# Patient Record
Sex: Female | Born: 1970
Health system: Southern US, Community
[De-identification: ages and names within clinical notes are randomized; demographics above are authoritative.]

## PROBLEM LIST (undated history)

## (undated) DIAGNOSIS — O2230 Deep phlebothrombosis in pregnancy, unspecified trimester: Secondary | ICD-10-CM

## (undated) DIAGNOSIS — Z22322 Carrier or suspected carrier of Methicillin resistant Staphylococcus aureus: Secondary | ICD-10-CM

## (undated) DIAGNOSIS — D649 Anemia, unspecified: Secondary | ICD-10-CM

## (undated) DIAGNOSIS — B019 Varicella without complication: Secondary | ICD-10-CM

## (undated) HISTORY — DX: Varicella without complication: B01.9

## (undated) HISTORY — DX: Deep phlebothrombosis in pregnancy, unspecified trimester: O22.30

## (undated) HISTORY — DX: Carrier or suspected carrier of methicillin resistant Staphylococcus aureus: Z22.322

## (undated) HISTORY — DX: Anemia, unspecified: D64.9

## (undated) HISTORY — PX: KNEE ARTHROSCOPY: SUR90

---

## 2001-03-04 ENCOUNTER — Encounter (INDEPENDENT_AMBULATORY_CARE_PROVIDER_SITE_OTHER): Payer: Self-pay | Admitting: *Deleted

## 2001-03-04 ENCOUNTER — Ambulatory Visit (HOSPITAL_COMMUNITY): Admission: AD | Admit: 2001-03-04 | Discharge: 2001-03-04 | Payer: Self-pay | Admitting: *Deleted

## 2001-03-04 ENCOUNTER — Encounter: Payer: Self-pay | Admitting: Emergency Medicine

## 2001-07-20 ENCOUNTER — Other Ambulatory Visit: Admission: RE | Admit: 2001-07-20 | Discharge: 2001-07-20 | Payer: Self-pay | Admitting: Gynecology

## 2001-08-25 DIAGNOSIS — O2230 Deep phlebothrombosis in pregnancy, unspecified trimester: Secondary | ICD-10-CM

## 2001-08-25 HISTORY — DX: Deep phlebothrombosis in pregnancy, unspecified trimester: O22.30

## 2001-09-06 ENCOUNTER — Ambulatory Visit (HOSPITAL_BASED_OUTPATIENT_CLINIC_OR_DEPARTMENT_OTHER): Admission: RE | Admit: 2001-09-06 | Discharge: 2001-09-06 | Payer: Self-pay | Admitting: Orthopedic Surgery

## 2001-09-11 ENCOUNTER — Ambulatory Visit (HOSPITAL_COMMUNITY): Admission: RE | Admit: 2001-09-11 | Discharge: 2001-09-11 | Payer: Self-pay | Admitting: Orthopedic Surgery

## 2001-09-11 ENCOUNTER — Inpatient Hospital Stay (HOSPITAL_COMMUNITY): Admission: AD | Admit: 2001-09-11 | Discharge: 2001-09-14 | Payer: Self-pay | Admitting: *Deleted

## 2002-02-07 ENCOUNTER — Inpatient Hospital Stay (HOSPITAL_COMMUNITY): Admission: AD | Admit: 2002-02-07 | Discharge: 2002-02-10 | Payer: Self-pay | Admitting: Gynecology

## 2002-02-12 ENCOUNTER — Inpatient Hospital Stay (HOSPITAL_COMMUNITY): Admission: AD | Admit: 2002-02-12 | Discharge: 2002-02-12 | Payer: Self-pay | Admitting: Gynecology

## 2002-02-12 ENCOUNTER — Encounter: Admission: RE | Admit: 2002-02-12 | Discharge: 2002-03-14 | Payer: Self-pay | Admitting: Gynecology

## 2002-05-16 ENCOUNTER — Other Ambulatory Visit: Admission: RE | Admit: 2002-05-16 | Discharge: 2002-05-16 | Payer: Self-pay | Admitting: Gynecology

## 2003-12-13 ENCOUNTER — Encounter: Admission: RE | Admit: 2003-12-13 | Discharge: 2003-12-13 | Payer: Self-pay | Admitting: Internal Medicine

## 2003-12-28 ENCOUNTER — Other Ambulatory Visit: Admission: RE | Admit: 2003-12-28 | Discharge: 2003-12-28 | Payer: Self-pay | Admitting: Obstetrics and Gynecology

## 2005-05-09 ENCOUNTER — Other Ambulatory Visit: Admission: RE | Admit: 2005-05-09 | Discharge: 2005-05-09 | Payer: Self-pay | Admitting: Obstetrics and Gynecology

## 2006-01-26 ENCOUNTER — Emergency Department (HOSPITAL_COMMUNITY): Admission: EM | Admit: 2006-01-26 | Discharge: 2006-01-26 | Payer: Self-pay | Admitting: Emergency Medicine

## 2006-06-24 ENCOUNTER — Other Ambulatory Visit: Admission: RE | Admit: 2006-06-24 | Discharge: 2006-06-24 | Payer: Self-pay | Admitting: Obstetrics and Gynecology

## 2006-12-06 ENCOUNTER — Emergency Department (HOSPITAL_COMMUNITY): Admission: EM | Admit: 2006-12-06 | Discharge: 2006-12-06 | Payer: Self-pay | Admitting: Emergency Medicine

## 2006-12-07 ENCOUNTER — Encounter: Payer: Self-pay | Admitting: Vascular Surgery

## 2006-12-07 ENCOUNTER — Ambulatory Visit (HOSPITAL_COMMUNITY): Admission: RE | Admit: 2006-12-07 | Discharge: 2006-12-07 | Payer: Self-pay | Admitting: Emergency Medicine

## 2006-12-07 ENCOUNTER — Ambulatory Visit: Payer: Self-pay | Admitting: Vascular Surgery

## 2006-12-16 ENCOUNTER — Ambulatory Visit: Payer: Self-pay | Admitting: Vascular Surgery

## 2009-03-07 ENCOUNTER — Emergency Department (HOSPITAL_COMMUNITY): Admission: EM | Admit: 2009-03-07 | Discharge: 2009-03-07 | Payer: Self-pay | Admitting: Family Medicine

## 2009-03-12 ENCOUNTER — Ambulatory Visit: Payer: Self-pay | Admitting: Internal Medicine

## 2009-03-12 DIAGNOSIS — D649 Anemia, unspecified: Secondary | ICD-10-CM

## 2009-03-12 LAB — CONVERTED CEMR LAB
ALT: 16 units/L (ref 0–35)
Alkaline Phosphatase: 93 units/L (ref 39–117)
Basophils Relative: 1.3 % (ref 0.0–3.0)
Bilirubin, Direct: 0.1 mg/dL (ref 0.0–0.3)
CO2: 31 meq/L (ref 19–32)
Creatinine, Ser: 1 mg/dL (ref 0.4–1.2)
Folate: >20 ng/mL
GFR calc non Af Amer: 79.9 mL/min (ref >60)
Glucose, Bld: 111 mg/dL — ABNORMAL HIGH (ref 70–99)
Iron: 181 ug/dL — ABNORMAL HIGH (ref 42–145)
Lymphocytes Relative: 32.3 % (ref 12.0–46.0)
MCHC: 33.8 g/dL (ref 30.0–36.0)
Monocytes Absolute: 0.4 10*3/uL (ref 0.1–1.0)
Neutro Abs: 5.4 10*3/uL (ref 1.4–7.7)
Neutrophils Relative %: 61.4 % (ref 43.0–77.0)
Platelets: 335 10*3/uL (ref 150.0–400.0)
Potassium: 3.4 meq/L — ABNORMAL LOW (ref 3.5–5.1)
Saturation Ratios: 37 % (ref 20.0–50.0)
TSH: 0.8 microintl units/mL (ref 0.35–5.50)
Vitamin B-12: >1500 pg/mL — ABNORMAL HIGH (ref 211–911)
WBC: 8.7 10*3/uL (ref 4.5–10.5)

## 2009-03-13 ENCOUNTER — Encounter: Payer: Self-pay | Admitting: Internal Medicine

## 2010-02-26 ENCOUNTER — Ambulatory Visit: Payer: Self-pay | Admitting: Internal Medicine

## 2010-02-26 DIAGNOSIS — H04129 Dry eye syndrome of unspecified lacrimal gland: Secondary | ICD-10-CM | POA: Insufficient documentation

## 2010-02-27 LAB — CONVERTED CEMR LAB
BUN: 11 mg/dL (ref 6–23)
Calcium: 9.1 mg/dL (ref 8.4–10.5)
Chloride: 106 meq/L (ref 96–112)
Eosinophils Absolute: 0.1 10*3/uL (ref 0.0–0.7)
Eosinophils Relative: 2.1 % (ref 0.0–5.0)
GFR calc non Af Amer: 140.62 mL/min (ref >60)
HCT: 31.2 % — ABNORMAL LOW (ref 36.0–46.0)
Hemoglobin: 10.2 g/dL — ABNORMAL LOW (ref 12.0–15.0)
Lymphs Abs: 2.3 10*3/uL (ref 0.7–4.0)
MCV: 84.1 fL (ref 78.0–100.0)
Monocytes Relative: 9.4 % (ref 3.0–12.0)
Neutro Abs: 2.2 10*3/uL (ref 1.4–7.7)
Neutrophils Relative %: 43.6 % (ref 43.0–77.0)
RBC: 3.71 M/uL — ABNORMAL LOW (ref 3.87–5.11)
Sodium: 139 meq/L (ref 135–145)

## 2010-03-11 ENCOUNTER — Ambulatory Visit: Payer: Self-pay | Admitting: Internal Medicine

## 2010-03-11 ENCOUNTER — Telehealth: Payer: Self-pay | Admitting: Internal Medicine

## 2010-03-11 DIAGNOSIS — E049 Nontoxic goiter, unspecified: Secondary | ICD-10-CM | POA: Insufficient documentation

## 2010-03-11 DIAGNOSIS — R05 Cough: Secondary | ICD-10-CM

## 2010-04-02 ENCOUNTER — Encounter: Admission: RE | Admit: 2010-04-02 | Discharge: 2010-04-02 | Payer: Self-pay | Admitting: Internal Medicine

## 2010-04-02 DIAGNOSIS — E041 Nontoxic single thyroid nodule: Secondary | ICD-10-CM | POA: Insufficient documentation

## 2010-04-04 ENCOUNTER — Encounter: Payer: Self-pay | Admitting: Internal Medicine

## 2010-04-04 ENCOUNTER — Ambulatory Visit: Payer: Self-pay | Admitting: Internal Medicine

## 2010-04-04 LAB — CONVERTED CEMR LAB
Basophils Relative: 0.6 % (ref 0.0–3.0)
Eosinophils Absolute: 0.1 10*3/uL (ref 0.0–0.7)
HCT: 32 % — ABNORMAL LOW (ref 36.0–46.0)
Hemoglobin: 10.5 g/dL — ABNORMAL LOW (ref 12.0–15.0)
Iron: 48 ug/dL (ref 42–145)
Lymphocytes Relative: 46.4 % — ABNORMAL HIGH (ref 12.0–46.0)
MCV: 83.7 fL (ref 78.0–100.0)
Neutrophils Relative %: 40.4 % — ABNORMAL LOW (ref 43.0–77.0)
Platelets: 237 10*3/uL (ref 150.0–400.0)
RBC: 3.82 M/uL — ABNORMAL LOW (ref 3.87–5.11)
RDW: 17.1 % — ABNORMAL HIGH (ref 11.5–14.6)
WBC: 3.7 10*3/uL — ABNORMAL LOW (ref 4.5–10.5)

## 2010-06-02 ENCOUNTER — Emergency Department (HOSPITAL_COMMUNITY): Admission: EM | Admit: 2010-06-02 | Discharge: 2010-06-02 | Payer: Self-pay | Admitting: Emergency Medicine

## 2010-09-03 ENCOUNTER — Emergency Department (HOSPITAL_COMMUNITY)
Admission: EM | Admit: 2010-09-03 | Discharge: 2010-09-03 | Payer: Self-pay | Source: Home / Self Care | Admitting: Family Medicine

## 2010-09-15 ENCOUNTER — Encounter: Payer: Self-pay | Admitting: Internal Medicine

## 2010-09-24 NOTE — Progress Notes (Signed)
Summary: OV TODAY  Phone Note Call from Patient   Summary of Call: Pt scheduled for office visit with Dr Felicity Coyer today. C/o CP x 1 wk. No SOB, lightheadedness, dizzyness. C/o cough from PND. She feels this has caused the CP. Pt will call office or go to ER w/new or more severe symptoms, otherwise will keep f/u office visit today.  Initial call taken by: Lamar Sprinkles, CMA,  March 11, 2010 10:15 AM  Follow-up for Phone Call        ok Follow-up by: Newt Lukes MD,  March 11, 2010 12:38 PM

## 2010-09-24 NOTE — Assessment & Plan Note (Signed)
Summary: COUGH---STC   Vital Signs:  Patient profile:   40 year old female Height:      68 inches Weight:      210.75 pounds BMI:     32.16 O2 Sat:      99 % on Room air Temp:     98.5 degrees F oral Pulse rate:   74 / minute Pulse rhythm:   regular Resp:     16 per minute BP sitting:   108 / 64  (left arm) Cuff size:   large  Vitals Entered By: Rock Nephew CMA (April 04, 2010 8:13 AM)  Nutrition Counseling: Patient's BMI is greater than 25 and therefore counseled on weight management options.  O2 Flow:  Room air CC: follow-up visit// continue cough, Cough Is Patient Diabetic? No Pain Assessment Patient in pain? no        Primary Care Lisandro Meggett:  Etta Grandchild MD  CC:  follow-up visit// continue cough and Cough.  History of Present Illness:  Cough      This is a 40 year old woman who presents with Cough.  The symptoms began 4-8 weeks ago.  The intensity is described as mild.  The patient reports non-productive cough, but denies productive cough, pleuritic chest pain, shortness of breath, wheezing, exertional dyspnea, fever, hemoptysis, and malaise.  The patient denies the following symptoms: cold/URI symptoms, sore throat, nasal congestion, chronic rhinitis, weight loss, acid reflux symptoms, and peripheral edema.  Ineffective prior treatments have included OTC cough medication.    Preventive Screening-Counseling & Management  Alcohol-Tobacco     Alcohol drinks/day: 0     Smoking Status: never  Hep-HIV-STD-Contraception     Hepatitis Risk: no risk noted     HIV Risk: no risk noted     STD Risk: no risk noted      Sexual History:  currently monogamous.        Drug Use:  no.        Blood Transfusions:  no.    Medications Prior to Update: 1)  Tessalon 200 Mg Caps (Benzonatate) .Marland Kitchen.. 1 By Mouth Three Times A Day X 5 Days, Then As Needed For Cough 2)  Indomethacin 25 Mg Caps (Indomethacin) .Marland Kitchen.. 1 By Mouth Three Times A Day X 5 Days, Then As Needed For  Pain 3)  Iron 325 (65 Fe) Mg Tabs (Ferrous Sulfate) .Marland Kitchen.. 1 By Mouth Two Times A Day With Food  Current Medications (verified): 1)  Tessalon 200 Mg Caps (Benzonatate) .Marland Kitchen.. 1 By Mouth Three Times A Day X 5 Days, Then As Needed For Cough 2)  Indomethacin 25 Mg Caps (Indomethacin) .Marland Kitchen.. 1 By Mouth Three Times A Day X 5 Days, Then As Needed For Pain 3)  Iron 325 (65 Fe) Mg Tabs (Ferrous Sulfate) .Marland Kitchen.. 1 By Mouth Two Times A Day With Food  Allergies (verified): No Known Drug Allergies  Past History:  Past Medical History: Last updated: 03/11/2010 Anemia-NOS   MD roster: optho - [place] [facility]  Family History: Last updated: 03/12/2009 Family History of Alcoholism/Addiction  Risk Factors: Alcohol Use: 0 (04/04/2010) Exercise: yes (03/12/2009)  Past Surgical History: Denies surgical history  Family History: Reviewed history from 03/12/2009 and no changes required. Family History of Alcoholism/Addiction  Social History: Reviewed history from 03/11/2010 and no changes required. Occupation: Marketing executive Single  Never Smoked Alcohol use-no Drug use-no Regular exercise-yes Hepatitis Risk:  no risk noted HIV Risk:  no risk noted STD Risk:  no risk noted Sexual History:  currently monogamous Blood Transfusions:  no  Review of Systems       The patient complains of prolonged cough.  The patient denies anorexia, fever, weight loss, weight gain, decreased hearing, hoarseness, chest pain, syncope, dyspnea on exertion, peripheral edema, headaches, hemoptysis, abdominal pain, melena, hematochezia, severe indigestion/heartburn, hematuria, suspicious skin lesions, transient blindness, enlarged lymph nodes, and angioedema.   Endo:  Denies cold intolerance, excessive hunger, excessive thirst, excessive urination, heat intolerance, polyuria, and weight change. Heme:  Denies abnormal bruising, bleeding, enlarge lymph nodes, fevers, pallor, and skin discoloration.  Physical Exam  General:   alert, well-developed, well-nourished, well-hydrated, appropriate dress, normal appearance, healthy-appearing, cooperative to examination, and good hygiene.   Head:  normocephalic, atraumatic, no abnormalities observed, and no abnormalities palpated.   Eyes:  vision grossly intact, pupils equal, pupils round, and pupils reactive to light.   Ears:  R ear normal and L ear normal.   Nose:  External nasal examination shows no deformity or inflammation. Nasal mucosa are pink and moist without lesions or exudates. Mouth:  Oral mucosa and oropharynx without lesions or exudates.  Teeth in good repair. Neck:  supple, full ROM, no masses, no thyromegaly, no thyroid nodules or tenderness, normal carotid upstroke, no carotid bruits, no cervical lymphadenopathy, and no neck tenderness.   Chest Wall:  no deformities, no tenderness, and no mass.   Breasts:  skin/areolae normal, no masses, and no abnormal thickening.   Lungs:  normal respiratory effort, no intercostal retractions, no accessory muscle use, normal breath sounds, no dullness, no fremitus, no crackles, and no wheezes.   Heart:  normal rate, regular rhythm, no murmur, no gallop, no rub, and no JVD.   Abdomen:  soft, non-tender, normal bowel sounds, no distention, no masses, no guarding, no rigidity, no rebound tenderness, no abdominal hernia, no inguinal hernia, no hepatomegaly, and no splenomegaly.   Msk:  normal ROM, no joint tenderness, no joint swelling, no joint warmth, no redness over joints, no joint deformities, no joint instability, and no crepitation.   Pulses:  R and L carotid,radial,femoral,dorsalis pedis and posterior tibial pulses are full and equal bilaterally Extremities:  No clubbing, cyanosis, edema, or deformity noted with normal full range of motion of all joints.   Neurologic:  No cranial nerve deficits noted. Station and gait are normal. Plantar reflexes are down-going bilaterally. DTRs are symmetrical throughout. Sensory, motor and  coordinative functions appear intact. Skin:  turgor normal, color normal, no rashes, no suspicious lesions, no ecchymoses, no petechiae, no purpura, no ulcerations, and no edema.   Cervical Nodes:  no anterior cervical adenopathy and no posterior cervical adenopathy.   Axillary Nodes:  no R axillary adenopathy and no L axillary adenopathy.   Inguinal Nodes:  no R inguinal adenopathy and no L inguinal adenopathy.   Psych:  Cognition and judgment appear intact. Alert and cooperative with normal attention span and concentration. No apparent delusions, illusions, hallucinations   Impression & Recommendations:  Problem # 1:  COUGH (ICD-786.2) Assessment Deteriorated spirometry is neg for asthma so I will look at Chest Xray for lesions and treat symptomatically with tussionex susp Orders: T-2 View CXR (71020TC) Spirometry w/Graph (94010)  Problem # 2:  THYROID CYST (ICD-246.2) Assessment: Improved this appears to be benign  Problem # 3:  ANEMIA-NOS (ICD-285.9) Assessment: Unchanged  Her updated medication list for this problem includes:    Iron 325 (65 Fe) Mg Tabs (Ferrous sulfate) .Marland Kitchen... 1 by mouth two times a day with food  Orders: Venipuncture (  16109) TLB-B12 + Folate Pnl (82746_82607-B12/FOL) TLB-IBC Pnl (Iron/FE;Transferrin) (83550-IBC) TLB-CBC Platelet - w/Differential (85025-CBCD)  Hgb: 10.2 (02/26/2010)   Hct: 31.2 (02/26/2010)   Platelets: 303.0 (02/26/2010) RBC: 3.71 (02/26/2010)   RDW: 15.5 (02/26/2010)   WBC: 5.1 (02/26/2010) MCV: 84.1 (02/26/2010)   MCHC: 32.6 (02/26/2010) Iron: 181 (03/12/2009)   % Sat: 37.0 (03/12/2009) B12: >1500 pg/mL (03/12/2009)   Folate: >20.0 ng/mL (03/12/2009)   TSH: 0.95 (02/26/2010)  Complete Medication List: 1)  Iron 325 (65 Fe) Mg Tabs (Ferrous sulfate) .Marland Kitchen.. 1 by mouth two times a day with food 2)  Tussionex Pennkinetic Er 8-10 Mg/48ml Lqcr (Chlorpheniramine-hydrocodone) .... 5 ml by mouth two times a day as needed for cough  Patient  Instructions: 1)  Please schedule a follow-up appointment in 2 weeks. Prescriptions: TUSSIONEX PENNKINETIC ER 8-10 MG/5ML LQCR (CHLORPHENIRAMINE-HYDROCODONE) 5 ml by mouth two times a day as needed for cough  #4 ounces x 1   Entered and Authorized by:   Etta Grandchild MD   Signed by:   Etta Grandchild MD on 04/04/2010   Method used:   Print then Give to Patient   RxID:   [id number]

## 2010-09-24 NOTE — Letter (Signed)
Summary: Results Follow-up Letter  [facility]  [address] [place], [place] [address]   Phone: [phone]  Fax: [phone]    04/04/2010  [address] [place], [place]  [address]  Dear Ms. Wadel,   The following are the results of your recent test(s):  Test     Result     Chest Xray     normal CBC       anemia Iron       slightly low B12 level     normal   _________________________________________________________  Please call for an appointment in 2-3 weeks _________________________________________________________ _________________________________________________________ _________________________________________________________  Sincerely,  Sanda Linger MD [facility]

## 2010-09-24 NOTE — Assessment & Plan Note (Signed)
Summary: EYE PROBLEM/ HER EYE DR. Dewayne Hatch HER THYROID CHECKED/NWS   Vital Signs:  Patient profile:   40 year old female Height:      68 inches (172.72 cm) Weight:      218.4 pounds (99.27 kg) BMI:     33.33 O2 Sat:      98 % on Room air Temp:     98.3 degrees F (36.83 degrees C) oral Pulse rate:   75 / minute BP sitting:   102 / 62  (left arm) Cuff size:   large  Vitals Entered By: Orlan Leavens (February 26, 2010 3:04 PM)  O2 Flow:  Room air CC: Having problems with her eyes. was told by opthamologist she need to have her thyroid check Is Patient Diabetic? No Pain Assessment Patient in pain? no        Primary Care Provider:  Etta Grandchild MD  CC:  Having problems with her eyes. was told by opthamologist she need to have her thyroid check.  History of Present Illness: c/o eye problems - dryness and tearing onset 3 months ago symptoms affect both eyes equally no vision changes or blindness - no blurring no allergy or sinus symptoms  recent eval by optho who felt eyes are fine but rec thyroid check - on sustain (OTC) drops every hour while awake to mosturize - +crusting each AM upon waking no mouth dryness - no neck swelling or trouble swallowing - no FH thyroid or DM problems  Current Medications (verified): 1)  None  Allergies (verified): No Known Drug Allergies  Past History:  Past Medical History: Anemia-NOS  MD roster: optho - [place] [facility]  Review of Systems  The patient denies fever, weight loss, vision loss, and headaches.    Physical Exam  General:  alert, well-developed, well-nourished, well-hydrated, appropriate dress, normal appearance, healthy-appearing, cooperative to examination, and good hygiene.   Eyes:  vision grossly intact; pupils equal, round and reactive to light.  conjunctiva and lids normal.    Ears:  normal pinnae bilaterally, without erythema, swelling, or tenderness to palpation. TMs clear, without effusion, or cerumen impaction.  Hearing grossly normal bilaterally  Mouth:  teeth and gums in good repair; mucous membranes moist, without lesions or ulcers. oropharynx clear without exudate, no erythema.  Neck:  thick and full but supple, full ROM, no masses, no cervical lymphadenopathy, and no neck tenderness.   Lungs:  normal respiratory effort, no intercostal retractions or use of accessory muscles; normal breath sounds bilaterally - no crackles and no wheezes.    Heart:  normal rate, regular rhythm, no murmur, and no rub. BLE without edema.   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) using saline drops now - ?allg component - check labs r/o thyroid dz, DM, infx or other inflamation contrib to her symptoms - no hx to support systemic autoimmune dz but eye symptoms may represent nonsp symptom early in course of dx -  rec f/u with PCP for close f/u same if these labs normal, rec cont f/u for tx same with optho pt understands and agrees to same Orders: TLB-BMP (Basic Metabolic Panel-BMET) (80048-METABOL) TLB-TSH (Thyroid Stimulating Hormone) (84443-TSH) TLB-CBC Platelet - w/Differential (85025-CBCD) TLB-Sedimentation Rate (ESR) (85652-ESR)  Patient Instructions: 1)  it was good to see you today. 2)  test(s) ordered today - your results will be posted on the phone tree for review in 48-72 hours from the time of test completion; call [phone] and enter your 9  digit MRN (listed above on this page, just below your name); if any changes need to be made or there are abnormal results, you will be contacted directly.  3)  Please schedule a follow-up appointment in 2 weeks with dr. Yetta Barre to review these results and the dry eye symptoms, call sooner if problems.

## 2010-09-24 NOTE — Assessment & Plan Note (Signed)
Summary: DR Nemiah Commander PT/NO SLOT--CHEST PAIN--STC   Vital Signs:  Patient profile:   40 year old female Height:      68 inches (172.72 cm) Weight:      217.8 pounds (99 kg) O2 Sat:      99 % on Room air Temp:     98.6 degrees F (37.00 degrees C) oral Pulse rate:   73 / minute BP sitting:   102 / 68  (left arm) Cuff size:   large  Vitals Entered By: Orlan Leavens (March 11, 2010 3:38 PM)  O2 Flow:  Room air CC: chest pain Is Patient Diabetic? No Pain Assessment Patient in pain? yes     Location: chest Type: SORENESS Comments Pt states her chest is sore. Have this cough tingling feeling. Pt denies pain dowin (L) arm, being nausated   Primary Care Provider:  Etta Grandchild MD  CC:  chest pain.  History of Present Illness:       This is a 40 year old female who presents with Chest pain.  The symptoms began 5 days ago.  On a scale of 1 to 10, the intensity is described as a 4 or 5.  no known heart problems.  The patient reports resting and activity related chest pain, but denies nausea, vomiting, diaphoresis, shortness of breath, palpitations, dizziness, light headedness, syncope, and indigestion.  The pain is described as intermittent and dull.  Occurs with cough or turnung stering wheel of car.  The pain is located in the substernal area and the pain does not radiate.  Episodes of chest pain last >30 minutes.  The pain is brought on or made worse by any activity and coughing.  The pain is relieved or improved with change in position.   has ot taken anything otc for cough or pain.  also reviewed prior OV from 02/26/10 c/o eye problems - dryness and tearing onset 3 months ago symptoms affect both eyes equally no vision changes or blindness - no blurring no allergy or sinus symptoms  recent eval by optho who felt eyes are fine but rec thyroid check - on sustain (OTC) drops every hour while awake to mosturize - +crusting each AM upon waking no mouth dryness - no neck swelling or trouble  swallowing - no FH thyroid or DM problems  wants to review lab results from 2 weeks ago  Current Medications (verified): 1)  None  Allergies (verified): No Known Drug Allergies  Past History:  Past Medical History: Anemia-NOS   MD roster: optho - [place] [facility]  Social History: Occupation: Marketing executive Single  Never Smoked Alcohol use-no Drug use-no Regular exercise-yes  Review of Systems  The patient denies fever, weight loss, syncope, dyspnea on exertion, hemoptysis, and abdominal pain.    Physical Exam  General:  alert, well-developed, well-nourished, and cooperative to examination.   nontox Eyes:  vision grossly intact; pupils equal, round and reactive to light.  conjunctiva and lids normal.    Ears:  normal pinnae bilaterally, without erythema, swelling, or tenderness to palpation. TMs clear, without effusion, or cerumen impaction. Hearing grossly normal bilaterally  Mouth:  teeth and gums in good repair; mucous membranes moist, without lesions or ulcers. oropharynx clear without exudate, min erythema. no pnd Chest Wall:  no deformities, no mass, and costochondrial tenderness.   Lungs:  normal respiratory effort, no intercostal retractions or use of accessory muscles; normal breath sounds bilaterally - no crackles and no wheezes.    Heart:  normal rate,  regular rhythm, no murmur, and no rub. BLE without edema. Neurologic:  She has very mild left facial, peripheral palsy. alert & oriented X3, strength normal in all extremities, sensation intact to light touch, sensation intact to pinprick, gait normal, and DTRs symmetrical and normal.     Impression & Recommendations:  Problem # 1:  COSTOCHONDRITIS (ICD-733.6)  expect this viral syndrome explains cough and chest pain - hx neg for cardiac flags and exam beign (afeb, no rub, hd stable) tx with indometh three times a day x 5 d, then as needed pain reassurance provided  Discussed medication use, applications of  heat or ice, and exercises.   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 1 by mouth two times a day with food  Hgb: 10.2 (02/26/2010)   Hct: 31.2 (02/26/2010)   Platelets: 303.0 (02/26/2010) RBC: 3.71 (02/26/2010)   RDW: 15.5 (02/26/2010)   WBC: 5.1 (02/26/2010) MCV: 84.1 (02/26/2010)   MCHC: 32.6 (02/26/2010) Iron: 181 (03/12/2009)   % Sat: 37.0 (03/12/2009) B12: >1500 pg/mL (03/12/2009)   Folate: >20.0 ng/mL (03/12/2009)   TSH: 0.95 (02/26/2010)  Complete Medication List: 1)  Tessalon 200 Mg Caps (Benzonatate) .Marland Kitchen.. 1 by mouth three times a day x 5 days, then as needed for cough 2)  Indomethacin 25 Mg Caps (Indomethacin) .Marland Kitchen.. 1 by mouth three times a day x 5 days, then as needed for pain 3)  Mucinex Dm 30-600 Mg Xr12h-tab (Dextromethorphan-guaifenesin) .Marland Kitchen.. 1 by mouth two times a day x 5 days 4)  Iron 325 (65 Fe) Mg Tabs (Ferrous  sulfate) .Marland Kitchen.. 1 by mouth two times a day with food  Patient Instructions: 1)  it was good to see you today. 2)  medications for cough and chest pain as discussed - your prescriptions have [facility] electronically [facility] to [facility] in [place]. Please take as directed. Contact our office if you believe you're having problems with the medication(s). 3)  we'll make referral for thyroid ultrasound to look for enlargemnt or nodules. Our office will contact you regarding this appointment once made.   4)  take iron pills for anemia - 5)  Please schedule a follow-up appointment in 3-4 weeks with dr. Yetta Barre to review eye symptoms and ultrasound results, sooner if problems.  Prescriptions: INDOMETHACIN 25 MG CAPS (INDOMETHACIN) 1 by mouth three times a day x 5 days, then as needed for  pain  #40 x 0   Entered and Authorized by:   Newt Lukes MD   Signed by:   Newt Lukes MD on 03/11/2010   Method used:   [facility] to        [facility]  [place] [id number]       [address]       [place], [place]  [address]       Ph: [phone] or [phone]       Fax: [phone]   RxID:   [id number] TESSALON 200 MG CAPS (BENZONATATE) 1 by mouth three times a day x 5 days, then as needed for cough  #40 x 1   Entered and Authorized by:   Newt Lukes MD   Signed by:   Newt Lukes MD on 03/11/2010   Method used:   [facility]        [facility]  [place] [facility]ret[facility]       [address]       [place], [place]  [address]       Ph: [phone] or [phone]       Fax: [phone]   RxID:   [id number]

## 2010-10-03 ENCOUNTER — Other Ambulatory Visit: Payer: Self-pay | Admitting: Internal Medicine

## 2010-10-03 ENCOUNTER — Telehealth: Payer: Self-pay | Admitting: Internal Medicine

## 2010-10-03 DIAGNOSIS — E041 Nontoxic single thyroid nodule: Secondary | ICD-10-CM

## 2010-10-08 ENCOUNTER — Other Ambulatory Visit: Payer: Self-pay

## 2010-10-08 ENCOUNTER — Ambulatory Visit
Admission: RE | Admit: 2010-10-08 | Discharge: 2010-10-08 | Disposition: A | Discharge status: Home / Self Care | Payer: 59 | Source: Ambulatory Visit | Attending: Internal Medicine | Admitting: Internal Medicine

## 2010-10-08 ENCOUNTER — Encounter: Payer: Self-pay | Admitting: Internal Medicine

## 2010-10-08 DIAGNOSIS — E041 Nontoxic single thyroid nodule: Secondary | ICD-10-CM

## 2010-10-10 NOTE — Progress Notes (Signed)
----   Converted from flag ---- ---- 10/03/2010 8:24 AM, Newt Lukes MD wrote: doesn't look like she has sched f/u with you on this at this time.... since she is your pt, do you mind taking this followup issue - thanks  ---- 04/02/2010 1:02 PM, Newt Lukes MD wrote: arrange repeat US ( 6-42mo) f/u for left thyroid cyst ([facility] pt) if not already done ------------------------------

## 2010-12-27 ENCOUNTER — Encounter: Payer: Self-pay | Admitting: Internal Medicine

## 2010-12-30 ENCOUNTER — Ambulatory Visit (INDEPENDENT_AMBULATORY_CARE_PROVIDER_SITE_OTHER)
Admission: RE | Admit: 2010-12-30 | Discharge: 2010-12-30 | Disposition: A | Discharge status: Home / Self Care | Payer: 59 | Source: Ambulatory Visit | Attending: Internal Medicine | Admitting: Internal Medicine

## 2010-12-30 ENCOUNTER — Encounter: Payer: Self-pay | Admitting: Internal Medicine

## 2010-12-30 ENCOUNTER — Other Ambulatory Visit (INDEPENDENT_AMBULATORY_CARE_PROVIDER_SITE_OTHER): Payer: 59

## 2010-12-30 ENCOUNTER — Ambulatory Visit (INDEPENDENT_AMBULATORY_CARE_PROVIDER_SITE_OTHER): Payer: 59 | Admitting: Internal Medicine

## 2010-12-30 DIAGNOSIS — M79671 Pain in right foot: Secondary | ICD-10-CM

## 2010-12-30 DIAGNOSIS — E041 Nontoxic single thyroid nodule: Secondary | ICD-10-CM

## 2010-12-30 DIAGNOSIS — M79609 Pain in unspecified limb: Secondary | ICD-10-CM

## 2010-12-30 DIAGNOSIS — D649 Anemia, unspecified: Secondary | ICD-10-CM

## 2010-12-30 DIAGNOSIS — E049 Nontoxic goiter, unspecified: Secondary | ICD-10-CM

## 2010-12-30 LAB — CBC WITH DIFFERENTIAL/PLATELET
Basophils Relative: 0.5 % (ref 0.0–3.0)
Eosinophils Relative: 3.3 % (ref 0.0–5.0)
HCT: 32.1 % — ABNORMAL LOW (ref 36.0–46.0)
Hemoglobin: 10.8 g/dL — ABNORMAL LOW (ref 12.0–15.0)
Lymphs Abs: 1.6 10*3/uL (ref 0.7–4.0)
MCV: 91.7 fl (ref 78.0–100.0)
Monocytes Relative: 9.6 % (ref 3.0–12.0)
Neutro Abs: 1.9 10*3/uL (ref 1.4–7.7)
RBC: 3.5 Mil/uL — ABNORMAL LOW (ref 3.87–5.11)
WBC: 4 10*3/uL — ABNORMAL LOW (ref 4.5–10.5)

## 2010-12-30 LAB — COMPREHENSIVE METABOLIC PANEL
BUN: 10 mg/dL (ref 6–23)
CO2: 27 mEq/L (ref 19–32)
GFR: 121.45 mL/min (ref >60.00)
Glucose, Bld: 75 mg/dL (ref 70–99)
Sodium: 138 mEq/L (ref 135–145)
Total Bilirubin: 0.4 mg/dL (ref 0.3–1.2)
Total Protein: 7 g/dL (ref 6.0–8.3)

## 2010-12-30 LAB — TSH: TSH: 0.95 u[IU]/mL (ref 0.35–5.50)

## 2010-12-30 NOTE — Assessment & Plan Note (Signed)
I will check plain films today and look at labs for gout, inflammation, etc

## 2010-12-30 NOTE — Progress Notes (Signed)
  Subjective:    Patient ID: April Levine, female    DOB: 1971-07-01, 40 y.o.   MRN: [id number]  HPI She comes in today c/o pain in her right foot on toes 2-5, she does not think she injured them. She is concerned that she may have gout. The pain is subsiding and now she has numbness and burning in the same toes. She has been taking OTC nsaids with good pain relief.   Review of Systems  Constitutional: Negative for fever, chills, diaphoresis, activity change, appetite change, fatigue and unexpected weight change.  HENT: Negative for sore throat, facial swelling, trouble swallowing, neck pain, neck stiffness and voice change.   Respiratory: Negative for apnea, cough, choking, chest tightness, shortness of breath, wheezing and stridor.   Cardiovascular: Negative for chest pain and leg swelling.  Gastrointestinal: Negative for nausea, vomiting, abdominal pain, diarrhea, constipation, blood in stool and abdominal distention.  Musculoskeletal: Positive for arthralgias. Negative for myalgias, back pain, joint swelling and gait problem.  Skin: Negative for color change, pallor, rash and wound.  Neurological: Negative for dizziness, facial asymmetry and headaches.  Hematological: Negative for adenopathy. Does not bruise/bleed easily.       Objective:   Physical Exam  [vitalsreviewed. Constitutional: She is oriented to person, place, and time. She appears well-developed and well-nourished. No distress.  HENT:  Head: Normocephalic and atraumatic.  Right Ear: External ear normal.  Left Ear: External ear normal.  Nose: Nose normal.  Mouth/Throat: Oropharynx is clear and moist. No oropharyngeal exudate.  Eyes: Conjunctivae and EOM are normal. Pupils are equal, round, and reactive to light. Right eye exhibits no discharge. Left eye exhibits no discharge. No scleral icterus.  Neck: Normal range of motion. Neck supple. No JVD present. No tracheal deviation present. No thyromegaly present.    Cardiovascular: Normal rate, regular rhythm, normal heart sounds and intact distal pulses.  Exam reveals no gallop and no friction rub.   No murmur heard. Pulmonary/Chest: Effort normal and breath sounds normal. No stridor. No respiratory distress. She has no wheezes. She has no rales. She exhibits no tenderness.  Abdominal: Soft. Bowel sounds are normal. She exhibits no distension and no mass. There is no tenderness. There is no rebound and no guarding.  Musculoskeletal: Normal range of motion. She exhibits no edema and no tenderness.       Right foot: Normal. She exhibits normal range of motion, no tenderness, no bony tenderness, no swelling, normal capillary refill, no crepitus, no deformity and no laceration.  Lymphadenopathy:    She has no cervical adenopathy.  Neurological: She is alert and oriented to person, place, and time. She has normal reflexes. She displays normal reflexes. No cranial nerve deficit. She exhibits normal muscle tone. Coordination normal.  Skin: Skin is warm and dry. No rash noted. She is not diaphoretic. No erythema. No pallor.  Psychiatric: She has a normal mood and affect. Her behavior is normal. Judgment and thought content normal.        Lab Results  Component Value Date   WBC 3.7* 04/04/2010   HGB 10.5* 04/04/2010   HCT 32.0* 04/04/2010   PLT 237.0 04/04/2010   ALT 16 03/12/2009   AST 15 03/12/2009   NA 139 02/26/2010   K 4.1 02/26/2010   CL 106 02/26/2010   CREATININE 0.6 02/26/2010   BUN 11 02/26/2010   CO2 29 02/26/2010   TSH 0.95 02/26/2010    Assessment & Plan:

## 2010-12-30 NOTE — Assessment & Plan Note (Signed)
I will recheck her CBC today and look at iron and B12 level as well

## 2010-12-30 NOTE — Assessment & Plan Note (Signed)
This appears to have resolved. 

## 2010-12-30 NOTE — Patient Instructions (Signed)
Foot Sprain The muscles and cord like structures which attach muscle to bone (tendons) that surround the feet are made up of units. A foot sprain can occur at the weakest spot in any of these units. This condition is most often caused by injury to or overuse of the foot, as from playing contact sports, or aggravating a previous injury, or from poor conditioning, or obesity.  DIAGNOSIS Diagnosis of this condition is usually by your own observation. If problems continue, a caregiver may be required for further evaluation and treatment. X-rays may be required to make sure there are not breaks in the bones (fractures) present. Continued problems may require physical therapy for treatment. SYMPTOMS OF FOOT SPRAIN ARE:  Pain with movement of the foot.   Tenderness and swelling at the injury site.   Loss of strength is present in moderate or severe sprains.  THE THREE GRADES OR SEVERITY OF FOOT SPRAIN ARE:  Mild (Grade I): Slightly pulled muscle without tearing of muscle or tendon fibers or loss of strength.   Moderate (Grade II): Tearing of fibers in a muscle, tendon, or at the attachment to bone, with small decrease in strength.   Severe (Grade III): Rupture of the muscle-tendon-bone attachment, with separation of fibers. Severe sprain requires surgical repair. Often repeating (chronic) sprains are caused by overuse. Sudden (acute) sprains are caused by direct injury or over-use.  HOME CARE INSTRUCTIONS  Apply ice to the injury for 20 minutes,3 times per day. Put the ice in a plastic bag and place a towel between the bag of ice and your skin.   An elastic wrap (like an Ace bandage) may be used to keep swelling down.   Keep foot above the level of the heart, or at least raised on a footstool, when swelling and pain are present.   Try to avoid use other than gentle range of motion while the foot is painful. Do not resume use until instructed by your caregiver. Then begin use gradually, not  increasing use to the point of pain. If pain does develop, decrease use and continue the above measures, gradually increasing activities that do not cause discomfort, until you gradually achieve normal use.   Use crutches if and as instructed, and for the length of time instructed.   Keep injured foot and ankle wrapped between treatments.   Massage foot and ankle for comfort and to keep swelling down. Massage from the toes up towards the knee.   Only take over-the-counter or prescription medicines for pain, discomfort, or fever as directed by your caregiver.  PREVENTION OF FOOT SPRAIN  Use strength and conditioning exercises appropriate for your sport.   Warm up properly prior to working out.   Use athletic shoes that are made for the sport you are participating in.   Allow adequate time for healing. Early return to activities makes repeat injury more likely, and can lead to an unstable arthritic foot that can result in prolonged disability. Mild sprains generally heal in 3 to 10 days, with moderate and severe sprains taking 2 to 10 weeks. Your caregiver can help you determine the proper time required for healing.  SEEK IMMEDIATE MEDICAL CARE IF:  Your pain and swelling increase, or pain is not controlled with medications.   You have loss of feeling in your foot or your foot turns cold or blue.   You develop new, unexplained symptoms, or an increase of the symptoms that brought you to your caregiver.  MAKE SURE YOU:  Understand these instructions.   Will watch your condition.   Will get help right away if you are not doing well or get worse.  Document Released: 01/31/2002 Document Re-Released: 11/05/2009 [facility] Patient Information 2011 [place], [place].Anemia - Nonspecific Your exam and blood tests show you are anemic. This means your blood (hemoglobin) level is low. Normal hemoglobin values are 12-15 for females and 14-17 for males. Make a note of your hemoglobin level today. The  hematocrit percent is also used to measure anemia. A normal hematocrit is 38-46 in females and 42-49 in males. Make a note of your hematocrit level today. SYMPTOMS Anemia can come on suddenly (acute). It can also come on slowly (chronic). Symptoms can include:  Minor weakness.   Dizziness.   Palpitations.  Shortness of breath.   Symptoms may be absent until half your hemoglobin is missing if it comes on slowly. Anemia due to acute blood loss from an injury or internal bleeding may require blood transfusion if the loss is severe. Hospital care is needed if you are anemic and there is significant continued blood loss. CAUSES Anemia can be due to many different causes.  Excessive bleeding from periods is a common problem in women.  Other causes can include:  Intestinal bleeding.   Poor nutrition.   Kidney, thyroid, liver, and bone marrow diseases.  TREATMENT  Stool tests for blood (Hemoccult) and additional lab tests are often needed. This determines the best treatment.   Further checking on your condition and your response to treatment is very important. It often takes many weeks to correct anemia.  Depending on the cause, treatment can include:  Supplements of iron.   Vitamins B12 and folic acid.   Hormone medicines.  If your anemia is due to bleeding, finding the cause of the blood loss is very important. This will help avoid further problems. SEEK IMMEDIATE MEDICAL CARE IF:  You develop fainting, extreme weakness, shortness of breath, or chest pain.   You develop heavy vaginal bleeding.   You develop bloody or black, tarry stools or vomit up blood.   You develop a high fever, rash, repeated vomiting, or dehydration.  Document Released: 09/18/2004 Document Re-Released: 01/29/2010 [facility] Patient Information 2011 [place], [place].

## 2010-12-31 ENCOUNTER — Encounter: Payer: Self-pay | Admitting: Internal Medicine

## 2010-12-31 LAB — C-REACTIVE PROTEIN: CRP: 0.4 mg/dL (ref <0.6)

## 2011-01-01 LAB — METHYLMALONIC ACID, SERUM: Methylmalonic Acid, Quantitative: 100 nmol/L (ref 87–318)

## 2011-01-10 NOTE — Op Note (Signed)
[facility]  Patient:    SHAJUANA, MCLUCAS                        MRN: [id number] Proc. Date: 03/04/01 Adm. Date:  [id number] Disc. Date: [id number] Attending:  Doug Sou                           Operative Report  INDICATIONS:                  A 40 year old woman, G6, P1-0-5-1, status post first trimester therapeutic abortion in [place] on February 27, 2001 who presented at [facility] on the evening of July 10 with complaints of increasing cramping and bleeding. She apparently has been taking doxycycline and Methergine but the pain has become more significant. She was transferred to [facility] for further evaluation. Ultrasound at [facility] suggested retained products of conception. Quantitative hCG was 10,870.  PREOPERATIVE DIAGNOSIS:       Retained products of conception, status post first trimester abortion on February 27, 2001.  POSTOPERATIVE DIAGNOSIS:      Retained products of conception, status post first trimester abortion on February 27, 2001.  OPERATION:                    Dilatation and evacuation.  SURGEON:                      Sung Amabile. Roslyn Smiling, M.D.  ANESTHESIA:                   IV sedation and paracervical block.  ESTIMATED BLOOD LOSS:         50 cc.  TUBES AND DRAINS:             None.  COMPLICATIONS:                None.  FINDINGS:                     Preoperative uterine size six to eight weeks, postoperative uterine size six weeks. Small fragments of tissue on curettage. Uterine cavity smooth.  SPECIMENS:                    Uterine curettings to pathology.  DESCRIPTION OF PROCEDURE:     The patient was consented prior to surgery regarding the benefits, risks, options, and expected outcome as well as alternatives to surgery. Risks including hemorrhage, transfusion, infection, uterine perforation with injury to intra-abdominal organs (bowel, bladder, reproductive organs, blood vessels, nerves, ureters) with possible  need for laparoscopy or laparotomy discussed. Retained products of conception possibility reviewed. Questions were answered and consent was obtained.  After the establishment of adequate IV sedation, the patient was placed in the dorsal lithotomy position. ______ The patient was draped. Graves speculum was inserted in the vagina and the cervix was reprepped with Betadine solution. The anterior cervical lip was infiltrated with 1% Xylocaine and then grasped with a single-tooth tenaculum. Paracervical block was placed in the usual fashion using 20 cc of 1% Xylocaine. Pratt dilators were used to dilate the cervix to a #25 [place]. A #8 suction curet was passed easily into the endometrial cavity. Suction curettage was performed. Small fragments of tissue were obtained. Gentle sharp curettage was performed. A final pass with the suction curet was made. The uterus was felt to be empty and  smooth at the end of the case. The cavity was regular. Instruments were removed and hemostasis was noted. The patient was returned to the supine position and transferred to the recovery room in satisfactory condition. DD:  03/04/01 TD:  03/04/01 Job: [id number]

## 2011-01-10 NOTE — Discharge Summary (Signed)
[facility]  Patient:    SENECA, GADBOIS Visit Number: [id number] MRN: [id number]          Service Type: GYN Location: [facility] Attending Physician:  Tonye Royalty Dictated by:   Antony Contras, [facility] Admit Date:  02/12/2002 Discharge Date: 02/12/2002                             Discharge Summary  DISCHARGE DIAGNOSES: 1. Previous cesarean section for elective repeat. 2. History of deep venous thrombosis and anticoagulation. 3. Extensive pelvic adhesions.  PROCEDURES:  Repeat cesarean section with extensive adehelysis, delivery of viable infant.  HISTORY OF PRESENT ILLNESS:  The patient is a 40 year old, gravida 7, para 1-0-5-1, with a last menstrual period of 05/12/01, estimated date of confinement 02/17/02.  Prenatal course was complicated by a history of deep venous thrombosis.  The patient was anticoagulated with Lovenox 100 mg subcutaneously b.i.d.  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  LABORATORY DATA:  Blood type A+, antibody screen negative, sickle cell negative.  RPR, GBS, HIV, nonreactive.  HOSPITAL COURSE:  The patient was admitted on 02/07/02, for repeat cesarean section.  The procedure was performed by Dr. Douglass Rivers, assisted by Dr. Reynaldo Minium under spinal anesthesia, converted to general.  Findings included delivery of a viable female infant, vertex presentation, Apgars 8 and 9, birth weight 7 pounds 1 ounce, normal tubes and ovaries, extensive thick uterine serosal adhesions to the anterior abdominal wall on the left.  Postoperatively, the patient remained afebrile.  Had no difficulty voiding, and was able to be discharged in satisfactory condition on her third postoperative day.  Lovenox was restarted postoperatively.  The patient was transitioned to Coumadin 10 mg p.o. prior to discharge.  LABORATORY DATA:  CBC showed a hematocrit of 26.3, hemoglobin 8.9, white  blood cell count 11.5, platelets 239.  FOLLOWUP:  In six weeks.  DISCHARGE MEDICATIONS: 1. Continue prenatal vitamins. 2. Iron. 3. Motrin for pain. 4. Tylox for pain. Dictated by:   Antony Contras, [facility] Attending Physician:  Tonye Royalty DD:  03/10/02 TD:  03/15/02 Job: [phone]

## 2011-01-10 NOTE — Op Note (Signed)
[facility]  Patient:    April Levine, April Levine Visit Number: [id number] MRN: [id number]          Service Type: OBS Location: 910A 9108 [id number] Attending Physician:  Douglass Rivers Dictated by:   Douglass Rivers, M.D. Proc. Date: 02/07/02 Admit Date:  02/07/2002                             Operative Report  PREOPERATIVE DIAGNOSES:       1. Previous cesarean section for elective                                  repeat.                               2. History of deep venous thrombosis and                                  anticoagulation.  POSTOPERATIVE DIAGNOSES:      1. Previous cesarean section for elective                                  repeat.                               2. History of deep venous thrombosis and                                  anticoagulation.                               3. Extensive pelvic adhesions.  OPERATION:                    Repeat cesarean section, low flap transverse                               with extensive adhesiolysis.  SURGEON:                      Douglass Rivers, M.D.  ASSISTANT:                    Gaetano Hawthorne. Lily Peer, M.D.  ANESTHESIA:                   Spinal converted to general.  ESTIMATED BLOOD LOSS:         1100 cc.  URINE OUTPUT:                 400 cc of clear urine.  FINDINGS:                     Viable female infant, vertex presentation, clear amniotic fluid.  Apgars 8 and 9.  Birth weight 7 pounds and 1 ounces.  Normal tubes and ovaries.  There was extensive thick uterine serosal adhesions to the anterior abdominal wall on the left.  COMPLICATIONS:  Extensive adhesiolysis.  PATHOLOGY:                    None.  DESCRIPTION OF PROCEDURE:     The patient was taken to the operating room, spinal anesthesia was induced, and placed in the supine position with left lateral displacement, prepped and draped in the usual sterile fashion.  A Pfannenstiel skin incision was made with the scalpel,  going through the previous scar.  Because of the recent Lovenox use, the remainder of the dissection was carried through with electrocautery with careful attention to any dissecting subcutaneous vessels.  The incision was carried through to the underlying fascia.  The fascia was scored in the midline.  The incision was then extended laterally with electrocautery over Kelly clamps.  This was done bilaterally.  The inferior aspect of the fascial incision was grasped with Kochers.  The underlying rectus muscles were dissected off by blunt and sharp dissection.  Again, this was also done with cautery with careful attention to any bleeding.  The superior aspect of the incision was then grasped with Kochers.  The underlying rectus muscles were partially dissected off.  They were markedly scarred and there was noted to be natural diastasis of the muscle.  For fear of injuring the peritoneal cavity with the Bovie, the dissection was aborted at that point.  The rectus muscles were then better separated and the peritoneum was entered bluntly.  The peritoneal incision was then extended inferiorly.  We were unable to actually separate the rectus muscles inferior around the level of the pyramidalis secondary to extensive scar tissue.  The bladder was markedly scarred to the peritoneum and the lower segment.  The pyramidalis muscles were then sharply dissected off with careful attention to any bleeders with the scalpel and careful attention to the bladder inferiorly.  However, we were unable to complete dissection because of difficulty delineating the bladder.  While we were inspecting our incision for adequacy, we came across very thick uterine serosal adhesions to the anterior abdominal wall that was approximately 4-5 cm at its widest, extending approximately 8 cm in length. This markedly impaired our ability for visualization and we decided that it needed to be dissected off.  The rectus muscles were  elevated with retractors. The scar was visualized and initially we began to take it down using the Bovie with cut and coag.  However, the adhesion was markedly vascular, and this was then aborted.  However, we had started to make a defect.  We were able to cross-clamp with a Kelly, transect, and suture ligate, moving up the scar until we felt that we could adequately expose the lower uterine segment. There was some bleeding that was noted, but we were able to place a warm pack in the incision, and we felt at this point that we had adequate exposure for the remainder of the C-section, and thus this area would be easier to expose once the uterus was smaller.  The bladder blade was then inserted.  The vesicouterine peritoneum was identified, tinted up, and entered bluntly.  With the Metzenbaums, the incision was then extended laterally.  The bladder flap was created digitally.  The bladder blade was then reinserted and the lower uterine segment was incised in a transverse fashion with the scalpel.  The incision was then extended sharply.  An amniotomy was performed for clear amniotic fluid.  The infant was delivered with the aid of baby [facility] forceps. The cord was cut and clamped and  then handed off to the waiting pediatricians. Cord bloods were obtained.  The placenta was manually extracted after a fail to separately naturally with uterine massage.  Because of the amount of serosal damage during the dissection, we elected to exteriorize the uterus. This was then performed.  The uterus of then cleared of all clots and debris. The uterine incision was repaired in a running locked layer of 0 chromic and it was noted to be hemostatic.  The large defect from the scar was then closed with interrupted figure-of-eight of 0 chromic.  Unfortunately, although the serosal area was reapproximated, the patient was having extensive bleeding from the needle sites throughout the entire defect.  We felt that  further suturing at this point would just exacerbate the problem because of the suturing required.  Attempt at cautery failed.  We then needed to place direct  pressure with warm packs over the area and this was held for approximately 30 minutes before hemostasis was adequately achieved in this area. Reinspection of the lower segment was hemostatic.  The tubes and ovaries were unremarkable.  There was some bleeding noted from the dissection site on the internal aspect of the peritoneum and muscle, and this was treated with cautery.  At this point, we attempted to return the uterus back to the abdomen, but because of patient discomfort, she was also pushing out her bowels, we ended up needing to convert the anesthesia to general.  After general anesthesia was then induced, the uterus was able to be successfully returned back to the pelvis.  Unfortunately, with the above maneuver, the bleeding of the serosal site began again.  Again, we placed a warm lap sponge in that area, and the incision was held.  After hemostasis was obtained again, Surgicel was then placed on the incision, and we elected at this point to place Surgicel on the uterine C-section scar. The peritoneum and muscles were inspected thoroughly.  Areas of bleeding were also treated with cautery until hemostasis in these areas was also assured. The fascia was then closed with 0 Vicryl starting at the apex and moving towards the midline bilaterally.  The subcutaneous was irrigated and the skin was closed with staples.  The patient tolerated the procedure well.  Sponge, lap, and needle counts were correct x2.  She was given Ancef intraoperatively, extubated in the OR, and transferred to the PACU in stable condition.  Total length of C-section was approximately two hours. Dictated by:   Douglass Rivers, M.D. Attending Physician:  Douglass Rivers DD:  02/07/02 TD:  02/09/02 Job: [id number]

## 2011-01-10 NOTE — Discharge Summary (Signed)
[facility]  Patient:    April Levine, April Levine Visit Number: [id number] MRN: [id number]          Service Type: MED Location: [id number] Attending Physician:  Wetzel Bjornstad Dictated by:   Antony Contras, [facility] Admit Date:  09/11/2001 Discharge Date: 09/14/2001                             Discharge Summary  HISTORY OF PRESENT ILLNESS:   The patient is a 40 year old gravida 5, para 1, AB3 at 17-weeks and 1 day pregnant.  The patient was transferred to [facility] after being evaluated by Dr. Turner Daniels in orthopedics.  She was seeing Dr. Turner Daniels for followup on knee surgery for locked knee, complaining of left calf tenderness and pain.  Dr. Turner Daniels had ordered a venous duplex of her left lower extremity showing a clot in her deep venous system. Dr. Katy Fitch was contacted regarding patient transfer and anticoagulation.  PAST MEDICAL HISTORY:         Insignificant.  PAST SURGICAL HISTORY:        History of three D&Cs for elective ABs and left knee surgery.  The patient does not have any prior history of deep venous thrombosis or family history.  She is admitted for anticoagulation.  HOSPITAL COURSE AND TREATMENT:                    The patient was admitted for anticoagulation and was bolused with 80 units of heparin per kg, then 15 units per kg per hour. PTTs were checked and found to be in the therapeutic range.  The patient responded to therapy and was able to be discharged on September 14, 2001 in satisfactory condition.  DISPOSITION:                  The patient was discharged on Lovenox 100 mg s.q. b.i.d. and was instructed on home administration.  Follow up for OB visit in one week.  Follow up with orthopedic surgeon as previously planned. Dictated by:   Antony Contras, [facility] Attending Physician:  Wetzel Bjornstad DD:  09/24/01 TD:  09/24/01 Job: [id number]

## 2011-01-10 NOTE — Op Note (Signed)
[place]. [facility]  Patient:    April Levine, April Levine Visit Number: [id number] MRN: [id number]          Service Type: DSU Location: [facility] Attending Physician:  Alinda Deem Dictated by:   Alinda Deem, M.D. Proc. Date: 09/06/01 Admit Date:  09/06/2001                             Operative Report  PREOPERATIVE DIAGNOSIS:  Locked left knee with lateral meniscal bucket-handle tear.  POSTOPERATIVE DIAGNOSIS:  Locked left knee with lateral meniscal bucket-handle tear.  PROCEDURE: 1. Left knee removal of displaced lateral meniscal bucket-handle tear. 2. 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  SURGEON:  Alinda Deem, M.D.  FIRST ASSISTANT:  Dorthula Matas, P.A.-C.  ANESTHESIA:  Local followed by general LMA.  ESTIMATED BLOOD LOSS:  Minimal.  FLUID REPLACEMENT:  500 cc crystalloid.  DRAINS:  None.  TOURNIQUET TIME:  None.  INDICATION FOR PROCEDURE:  A 40 year old woman in her sixteenth week of pregnancy, whose left knee locked up on the first of January 2003.  I saw her in my office on around the 10th of January with a locked left knee.  We discussed options with her obstetrician and because she had severe pain with a locked left knee, it was felt that arthroscopic surgery to unlock her knee and remove the cartilage tear was the most reasonable choice, accepting the slight but real risk to the fetus.  Preoperative and postoperative fetal monitoring or listening to the fetal heart tones was recommended and done, and she was prepared for surgical intervention.  DESCRIPTION OF PROCEDURE:  The patient identified by arm band and taken to the operating room at [facility] day surgery center after first listening to the fetal heart tones.  Local anesthesia was induced into the left knee, which was held in a locked position at about 45 degrees and after local anesthesia, she  came out to about 15 degrees but still had significant pain.  Because of that, IV sedation was administered.  A lateral post was applied to the table and the left lower extremity prepped and draped in the usual sterile fashion from the ankle to the midthigh.  Because of persistent pain in the knee despite the block, she then underwent general LMA anesthesia after attempting arthroscopy through inferomedial and inferolateral peripatellar portals were done with a #11 blade.  Once she was under general LMA anesthesia, we immediately identified the displaced, fragmented lateral meniscal bucket-handle tear, and this was removed with a 4.2 [place] sucker-shaver starting at the root of the horn posteriorly and going to the anterior lateral portion, where it was intact.  We also identified chondromalacia of the lateral femoral condyle, focal grade 3, which was debrided back to stable margin with a 4.2 [employer] as well as a 3.5 Gator sucker shaver.  Similar findings were found in the medial femoral condyle, which was also lightly debrided.  The trochlea and the patella were in good condition, and the ACL was intact, as was the PCL.  The patient did receive 1 g of Ancef intraoperatively.  The arthroscopic instruments were removed.  A dressing of Xeroform, 4 x 4 dressing sponges, Webril, and an Ace wrap applied.  The patient awakened and taken to the recovery room without difficulty. Dictated by:   Alinda Deem, M.D. Attending  Physician:  Alinda Deem DD:  09/06/01 TD:  09/06/01 Job: [id number]

## 2011-01-10 NOTE — H&P (Signed)
[facility]  Patient:    April Levine, April Levine Visit Number: [id number] MRN: [id number]          Service Type: OBS Location: 9300 [id number] Attending Physician:  Wetzel Bjornstad Dictated by:   Katy Fitch, M.D. Admit Date:  09/11/2001                           History and Physical  CHIEF COMPLAINT:              Left deep venous thrombosis.  HISTORY OF PRESENT ILLNESS:   The patient is a 40 year old G5, P1, AB3 at 17 weeks and one day by last menstrual period who was transferred to [facility] after being evaluated by Dr. Turner Daniels in orthopedics.  The patient was seeing Dr. Turner Daniels for followup on knee sugery for locked knee complaining of left calf tenderness and pain.  Secondary to her having had prior surgery, Dr. Turner Daniels had ordered a venous duplex of her left lower extremity showing a clot in her deep venous system.  Upon this report, he contacted me regarding patient transfer and anticoagulation for which I had the patient sent to [facility] for evaluation.  The patient has had no other prenatal complications or other history of blood clots in her family.  The patient has never had another problem with miscarriages or clots before.  PAST MEDICAL HISTORY:         None.  PAST SURGICAL HISTORY:        Three D&Cs for elective abortions and left knee surgery.  MEDICATIONS:                  Prenatal vitamins.  ALLERGIES:                    None.  SOCIAL HISTORY:               The patient is married, denies any tobacco, alcohol, or drugs.  FAMILY HISTORY:               Without any mental retardation, epithelial cancers, or blood clots.  PHYSICAL EXAMINATION:  VITAL SIGNS:                  Blood pressure 122/70.  HEENT:                        Throat clear.  LUNGS:                        Clear to auscultation bilaterally.  HEART:                        Regular rate and rhythm.  ABDOMEN:                      Soft, nontender.  Fetal  heart tones 150s per Doppler.  EXTREMITIES:                  Left lower extremity swollen and tender to touch.  There are bilateral pulses present in the dorsal pedis on both side.  ASSESSMENT/PLAN:              A 40 year old gravida 5, para 1, abortions 3 at 17 weeks and 1 day with left deep venous thrombosis probably secondary to locked knee and surgical treatment.  The  patient does not have any prior history of deep venous thrombosis or family history.  I would not order coagulation studies at this point.  However, the patient was offered heparin inpatient anticoagulation versus outpatient Lovenox therapy.  Risks of heparin therapy were explained to the patient including osteopenia for which we could supplement calcium and heparin-induced thrombocytopenia in which her platelet counts could potentially drop; however, we would monitor them closely after the first 72 hours.  The patient has agreed to stay inpatient and be monitored.  We will bolus her with 80 units/kg and then 15 units/kg/hr.  We will check PTTs every 4 hours until they are therapeutic between 60 and 80. After she is therapeutic, we will arrange home health to teach the patient to do subcutaneous injections probably anywhere from 7500 to 10,000 b.i.d. and will monitor CBC weekly for platelet count.  The patient is agreeable to the plan, and she will be admitted on January 18. Dictated by:   Katy Fitch, M.D. Attending Physician:  Wetzel Bjornstad DD:  09/11/01 TD:  09/11/01 Job: [id number]

## 2011-01-17 ENCOUNTER — Encounter: Payer: 59 | Admitting: Internal Medicine

## 2011-01-31 ENCOUNTER — Encounter: Payer: 59 | Admitting: Internal Medicine

## 2011-01-31 DIAGNOSIS — Z0289 Encounter for other administrative examinations: Secondary | ICD-10-CM

## 2011-07-19 ENCOUNTER — Emergency Department (INDEPENDENT_AMBULATORY_CARE_PROVIDER_SITE_OTHER)
Admission: EM | Admit: 2011-07-19 | Discharge: 2011-07-19 | Disposition: A | Discharge status: Home / Self Care | Payer: BC Managed Care – PPO | Source: Home / Self Care | Attending: Family Medicine | Admitting: Family Medicine

## 2011-07-19 ENCOUNTER — Emergency Department (HOSPITAL_COMMUNITY)
Admission: EM | Admit: 2011-07-19 | Discharge: 2011-07-19 | Disposition: A | Discharge status: Home / Self Care | Payer: BC Managed Care – PPO | Attending: Emergency Medicine | Admitting: Emergency Medicine

## 2011-07-19 ENCOUNTER — Encounter (HOSPITAL_COMMUNITY): Payer: Self-pay | Admitting: Emergency Medicine

## 2011-07-19 ENCOUNTER — Other Ambulatory Visit: Payer: Self-pay

## 2011-07-19 DIAGNOSIS — L03311 Cellulitis of abdominal wall: Secondary | ICD-10-CM

## 2011-07-19 DIAGNOSIS — T50905A Adverse effect of unspecified drugs, medicaments and biological substances, initial encounter: Secondary | ICD-10-CM

## 2011-07-19 DIAGNOSIS — L0390 Cellulitis, unspecified: Secondary | ICD-10-CM

## 2011-07-19 DIAGNOSIS — L02219 Cutaneous abscess of trunk, unspecified: Secondary | ICD-10-CM | POA: Insufficient documentation

## 2011-07-19 DIAGNOSIS — R55 Syncope and collapse: Secondary | ICD-10-CM | POA: Insufficient documentation

## 2011-07-19 MED ORDER — HYDROCODONE-ACETAMINOPHEN 5-325 MG PO TABS: ORAL_TABLET | ORAL | Status: AC

## 2011-07-19 MED ORDER — HYDROCODONE-ACETAMINOPHEN 5-325 MG PO TABS
1.0000 | ORAL_TABLET | Freq: Once | ORAL | Status: AC
  Administered 2011-07-19: 1 via ORAL
  Filled 2011-07-19: qty 1

## 2011-07-19 MED ORDER — SULFAMETHOXAZOLE-TRIMETHOPRIM 800-160 MG PO TABS: 1.0000 | ORAL_TABLET | Freq: Two times a day (BID) | ORAL | Status: AC

## 2011-07-19 NOTE — ED Provider Notes (Signed)
History     CSN: [id number] Arrival date & time: 07/19/2011 10:10 AM   First MD Initiated Contact with Patient 07/19/11 0940      Chief Complaint  Patient presents with  . Recurrent Skin Infections    (Consider location/radiation/quality/duration/timing/severity/associated sxs/prior treatment) Patient is a 40 y.o. female presenting with abscess.  Abscess  This is a new problem. The current episode started less than one week ago. The onset was sudden. The problem has been gradually worsening. The abscess is present on the abdomen. The problem is moderate. The abscess is characterized by painfulness and redness. The abscess first occurred at home. Pertinent negatives include no fever.    Past Medical History  Diagnosis Date  . Anemia     No past surgical history on file.  Family History  Problem Relation Age of Onset  . Alcohol abuse Other     History  Substance Use Topics  . Smoking status: Never Smoker   . Smokeless tobacco: Not on file  . Alcohol Use: No    OB History    Grav Para Term Preterm Abortions TAB SAB Ect Mult Living                  Review of Systems  Constitutional: Negative for fever.  HENT: Negative.   Eyes: Negative.   Respiratory: Negative.   Cardiovascular: Negative.   Gastrointestinal: Negative.   Genitourinary: Negative.   Skin: Positive for color change and wound. Negative for rash.  Neurological: Negative.     Allergies  Review of patient's allergies indicates no known allergies.  Home Medications   Current Outpatient Rx  Name Route Sig Dispense Refill  . HYDROCOD POLST-CHLORPHEN POLST 10-8 MG/5ML PO LQCR Oral Take 5 mLs by mouth 2 (two) times daily as needed. For cough     . FERROUS SULFATE 325 (65 FE) MG PO TABS Oral Take 325 mg by mouth 2 (two) times daily.        BP 129/89  Pulse 95  Temp(Src) 98.5 F (36.9 C) (Oral)  Resp 18  SpO2 100%  Physical Exam  Nursing note and vitals reviewed. Constitutional: She is  oriented to person, place, and time. She appears well-developed and well-nourished.  HENT:  Head: Normocephalic and atraumatic.  Eyes: EOM are normal.  Neck: Normal range of motion.  Abdominal: Soft.  Neurological: She is alert and oriented to person, place, and time.  Skin: Skin is warm and dry. No ecchymosis, no laceration, no lesion and no rash noted. There is erythema.       ED Course  Procedures (including critical care time)  Labs Reviewed - No data to display No results found.   No diagnosis found.    MDM          Richardo Priest, MD 07/19/11 1037

## 2011-07-19 NOTE — ED Notes (Signed)
Patient has a large red area to right side of abdomen.  Unknown injury.  Area does appear to have a small scab in center .  Area started out as a bump.  Did have a white top and patient squeezed bump.  Since then has become red and painful.  Patient reports flu-like symptoms

## 2011-07-19 NOTE — ED Provider Notes (Signed)
History     CSN: [id number] Arrival date & time: 07/19/2011  4:51 PM   First MD Initiated Contact with Patient 07/19/11 1729      Chief Complaint  Patient presents with  . Near Syncope    (Consider location/radiation/quality/duration/timing/severity/associated sxs/prior treatment) Patient is a 40 y.o. female presenting with syncope. The history is provided by the patient.  Loss of Consciousness This is a new problem. The current episode started today. Pertinent negatives include no chills or fever. Associated symptoms comments: The patient was seen at the urgent Care earlier today and given Vicodin and an antibiotic for cellulitis on her right abdominal wall. She took the Vicodin around 12:30 and went to sleep. When she woke she felt lightheaded and had a near syncopal episode.. The symptoms are aggravated by nothing.    Past Medical History  Diagnosis Date  . Anemia   . Anemia     Past Surgical History  Procedure Date  . Knee arthroscopy     Family History  Problem Relation Age of Onset  . Alcohol abuse Other     History  Substance Use Topics  . Smoking status: Never Smoker   . Smokeless tobacco: Not on file  . Alcohol Use: Yes     ocassionally    OB History    Grav Para Term Preterm Abortions TAB SAB Ect Mult Living                  Review of Systems  Constitutional: Negative for fever and chills.  HENT: Negative.   Respiratory: Negative.   Cardiovascular: Positive for syncope.  Gastrointestinal: Negative.   Musculoskeletal: Negative.   Skin: Negative.   Neurological: Positive for light-headedness.       See HPI.    Allergies  Review of patient's allergies indicates no known allergies.  Home Medications   Current Outpatient Rx  Name Route Sig Dispense Refill  . HYDROCODONE-ACETAMINOPHEN 5-325 MG PO TABS  Take one to two tablets every 4 to 6 hours as needed for pain 20 tablet 0  . SULFAMETHOXAZOLE-TRIMETHOPRIM 800-160 MG PO TABS Oral Take 1  tablet by mouth 2 (two) times daily. 14 tablet 0    BP 114/60  Pulse 95  Temp(Src) 99.2 F (37.3 C) (Oral)  Resp 20  SpO2 100%  LMP 07/19/2011  Physical Exam  Constitutional: She is oriented to person, place, and time. She appears well-developed and well-nourished.  HENT:  Head: Normocephalic.  Eyes: Pupils are equal, round, and reactive to light.  Neck: Normal range of motion. Neck supple.  Cardiovascular: Normal rate and regular rhythm.   Pulmonary/Chest: Effort normal and breath sounds normal.  Abdominal: Soft. Bowel sounds are normal. There is no rebound and no guarding.       Large red, warm area right upper abdominal wall with central induration and no fluctuance. ?abscess vs. Cellulitis.  Musculoskeletal: Normal range of motion.  Neurological: She is alert and oriented to person, place, and time. She has normal strength and normal reflexes. No cranial nerve deficit or sensory deficit. She displays a negative Romberg sign. Coordination normal.  Skin: Skin is warm and dry. No rash noted.  Psychiatric: She has a normal mood and affect.    ED Course  Procedures (including critical care time)  Labs Reviewed - No data to display No results found.   No diagnosis found.    MDM  Bedside ultrasound performed by Dr. Bebe Shaggy. No drainable abscess observed.    Date: 07/19/2011  Rate:  83  Rhythm: normal sinus rhythm  QRS Axis: Right axis deviation  Intervals: normal  ST/T Wave abnormalities: normal  Conduction Disutrbances:none  Narrative Interpretation:   Old EKG Reviewed: none available        Rodena Medin, PA 07/19/11 2014  Rodena Medin, PA 07/19/11 2015

## 2011-07-19 NOTE — ED Notes (Signed)
Pt was seen at [facility] this am, dx with cellulitis to right side of abdomen. Pt was given rx for pain meds and antibiotics. This afternoon pt had a "near syncopal" episode. Pt a/o x 4 at present

## 2011-07-20 NOTE — ED Provider Notes (Signed)
Medical screening examination/treatment/procedure(s) were performed by non-physician practitioner and as supervising physician I was immediately available for consultation/collaboration.   Lyanne Co, MD 07/20/11 [phone]

## 2011-07-21 ENCOUNTER — Encounter (HOSPITAL_COMMUNITY): Payer: Self-pay

## 2011-07-21 ENCOUNTER — Emergency Department (HOSPITAL_COMMUNITY)
Admission: EM | Admit: 2011-07-21 | Discharge: 2011-07-22 | Disposition: A | Discharge status: Home / Self Care | Payer: BC Managed Care – PPO | Attending: Emergency Medicine | Admitting: Emergency Medicine

## 2011-07-21 ENCOUNTER — Encounter (HOSPITAL_COMMUNITY): Payer: Self-pay | Admitting: Emergency Medicine

## 2011-07-21 ENCOUNTER — Emergency Department (INDEPENDENT_AMBULATORY_CARE_PROVIDER_SITE_OTHER)
Admission: EM | Admit: 2011-07-21 | Discharge: 2011-07-21 | Disposition: A | Discharge status: Nursing Home (Includes ICF) | Payer: BC Managed Care – PPO | Source: Home / Self Care | Attending: Emergency Medicine | Admitting: Emergency Medicine

## 2011-07-21 DIAGNOSIS — L02211 Cutaneous abscess of abdominal wall: Secondary | ICD-10-CM

## 2011-07-21 DIAGNOSIS — M79609 Pain in unspecified limb: Secondary | ICD-10-CM | POA: Insufficient documentation

## 2011-07-21 DIAGNOSIS — L03319 Cellulitis of trunk, unspecified: Secondary | ICD-10-CM

## 2011-07-21 DIAGNOSIS — L02219 Cutaneous abscess of trunk, unspecified: Secondary | ICD-10-CM

## 2011-07-21 NOTE — ED Notes (Signed)
Received pt. From triage, pt. Alert and oriented, NAD noted, right flank area red pt. C/o right axilla pain,

## 2011-07-21 NOTE — ED Notes (Signed)
1 week hx of redness and pain noted to right lower quadrant of abdomen.  was seen here at the Urgent Care on Saturday and was rx with hydrocodone and sulfamethoxazole. symptoms are not better and skin to RLQ still red and painful

## 2011-07-21 NOTE — ED Notes (Signed)
In addition, pt. Went to [facility] ED Saturday night for nausea and vomitting related to medications.  They took an ultrasound of stomach, which was negative.

## 2011-07-21 NOTE — ED Provider Notes (Addendum)
History     CSN: [id number] Arrival date & time: 07/21/2011  2:43 PM   First MD Initiated Contact with Patient 07/21/11 1447      Chief Complaint  Patient presents with  . Cellulitis    1 week hx of redness and pain noted to right lower quadrant of abdomen.  was seen here at the Urgent Care on Saturday and was rx with hydrocodone and sulfamethoxazole. symptoms are not better and skin to RLQ still red and painful    (Consider location/radiation/quality/duration/timing/severity/associated sxs/prior treatment) HPI Comments: Its worse area has become larger and more tender and feels swollen, having headaches and chills " no fevers" , having headaches" HA  Patient is a 40 y.o. female presenting with abdominal pain. The history is provided by the patient and a relative.  Abdominal Pain The primary symptoms of the illness include abdominal pain and fatigue.  The patient states that she believes she is currently not pregnant. Additional symptoms associated with the illness include chills and anorexia. Symptoms associated with the illness do not include urgency. Significant associated medical issues do not include PUD, GERD or diverticulitis.    Past Medical History  Diagnosis Date  . Anemia   . Anemia     Past Surgical History  Procedure Date  . Knee arthroscopy     Family History  Problem Relation Age of Onset  . Alcohol abuse Other     History  Substance Use Topics  . Smoking status: Never Smoker   . Smokeless tobacco: Not on file  . Alcohol Use: Yes     ocassionally    OB History    Grav Para Term Preterm Abortions TAB SAB Ect Mult Living                  Review of Systems  Constitutional: Positive for chills and fatigue.  Gastrointestinal: Positive for abdominal pain and anorexia.  Genitourinary: Negative for urgency.    Allergies  Review of patient's allergies indicates no known allergies.  Home Medications   Current Outpatient Rx  Name Route Sig  Dispense Refill  . HYDROCODONE-ACETAMINOPHEN 5-325 MG PO TABS  Take one to two tablets every 4 to 6 hours as needed for pain 20 tablet 0  . SULFAMETHOXAZOLE-TRIMETHOPRIM 800-160 MG PO TABS Oral Take 1 tablet by mouth 2 (two) times daily. 14 tablet 0    BP 132/69  Pulse 95  Temp(Src) 98.6 F (37 C) (Oral)  Resp 16  SpO2 100%  LMP 07/19/2011  Physical Exam  Constitutional: She appears well-nourished.  Abdominal: There is tenderness. There is no rebound and no guarding.  Skin:       ED Course  Procedures- NONE INDURATION area its extensive with some areas of fluctuancy  MDM  Worsening- cellulitis with palpable fluctuant about 8 cm - extensive. > 1 week        Jimmie Molly, MD 07/21/11 1517  Jimmie Molly, MD 07/21/11 [phone]

## 2011-07-21 NOTE — ED Notes (Signed)
Spoke with pt.  Delay explained.  Pt does not appear in any acute distress at this time

## 2011-07-21 NOTE — ED Notes (Signed)
Has a abcess infection on abd and went to ucc and was sent here for further tests  Rt side abd abcess is getting redder

## 2011-07-22 MED ORDER — CEPHALEXIN 500 MG PO CAPS: 500.0000 mg | ORAL_CAPSULE | Freq: Four times a day (QID) | ORAL | Status: DC

## 2011-07-22 MED ORDER — ONDANSETRON 4 MG PO TBDP
8.0000 mg | ORAL_TABLET | Freq: Once | ORAL | Status: AC
  Administered 2011-07-22: 8 mg via ORAL
  Filled 2011-07-22 (×2): qty 1

## 2011-07-22 MED ORDER — HYDROCODONE-ACETAMINOPHEN 5-325 MG PO TABS: 2.0000 | ORAL_TABLET | ORAL | Status: DC | PRN

## 2011-07-22 MED ORDER — OXYCODONE-ACETAMINOPHEN 5-325 MG PO TABS
2.0000 | ORAL_TABLET | Freq: Once | ORAL | Status: AC
  Administered 2011-07-22: 2 via ORAL
  Filled 2011-07-22: qty 2

## 2011-07-22 NOTE — ED Notes (Signed)
Pt. Discharged to home with family, pt. Alert and oriented, NAD noted

## 2011-07-23 ENCOUNTER — Encounter (HOSPITAL_COMMUNITY): Payer: Self-pay

## 2011-07-23 ENCOUNTER — Emergency Department (INDEPENDENT_AMBULATORY_CARE_PROVIDER_SITE_OTHER)
Admission: EM | Admit: 2011-07-23 | Discharge: 2011-07-23 | Disposition: A | Discharge status: Home / Self Care | Payer: BC Managed Care – PPO | Source: Home / Self Care | Attending: Family Medicine | Admitting: Family Medicine

## 2011-07-23 ENCOUNTER — Ambulatory Visit: Payer: BC Managed Care – PPO | Admitting: Internal Medicine

## 2011-07-23 DIAGNOSIS — IMO0002 Reserved for concepts with insufficient information to code with codable children: Secondary | ICD-10-CM

## 2011-07-23 DIAGNOSIS — K651 Peritoneal abscess: Secondary | ICD-10-CM

## 2011-07-23 NOTE — ED Provider Notes (Signed)
History     CSN: [id number] Arrival date & time: 07/23/2011  3:30 PM   First MD Initiated Contact with Patient 07/23/11 1529      Chief Complaint  Patient presents with  . Wound Check    (Consider location/radiation/quality/duration/timing/severity/associated sxs/prior treatment) HPI Comments: April Levine presents for re-evaluation of an abscess that was incised and drained in the ED yesterday. She was originally seen here in the and diagnosed with cellulitis and abscess of the abdomen. She was seen in the ED later that day. She has had it ultrasounded and ultimately I&D'd. She reports persistent pain but no other complaints.   Patient is a 40 y.o. female presenting with wound check. The history is provided by the patient.  Wound Check  She was treated in the ED yesterday. Previous treatment in the ED includes I&D of abscess. Treatments since wound repair include oral antibiotics and a wound recheck. There has been colored discharge from the wound. There is no redness present. There is no swelling present. The pain has not changed.    Past Medical History  Diagnosis Date  . Anemia   . Anemia     Past Surgical History  Procedure Date  . Knee arthroscopy     Family History  Problem Relation Age of Onset  . Alcohol abuse Other     History  Substance Use Topics  . Smoking status: Never Smoker   . Smokeless tobacco: Not on file  . Alcohol Use: Yes     ocassionally    OB History    Grav Para Term Preterm Abortions TAB SAB Ect Mult Living                  Review of Systems  Constitutional: Negative.   HENT: Negative.   Eyes: Negative.   Respiratory: Negative.   Cardiovascular: Negative.   Gastrointestinal: Negative.   Genitourinary: Negative.   Musculoskeletal: Negative.   Skin: Positive for wound.       I&D RIGHT sided abdominal wall  Neurological: Negative.   Psychiatric/Behavioral: Negative.     Allergies  Review of patient's allergies indicates no known  allergies.  Home Medications   Current Outpatient Rx  Name Route Sig Dispense Refill  . CEPHALEXIN 500 MG PO CAPS Oral Take 1 capsule (500 mg total) by mouth 4 (four) times daily. 28 capsule 0  . HYDROCODONE-ACETAMINOPHEN 5-325 MG PO TABS  Take one to two tablets every 4 to 6 hours as needed for pain 20 tablet 0  . SULFAMETHOXAZOLE-TRIMETHOPRIM 800-160 MG PO TABS Oral Take 1 tablet by mouth 2 (two) times daily. 14 tablet 0    BP 128/79  Pulse 75  Temp(Src) 98.7 F (37.1 C) (Oral)  Resp 18  SpO2 99%  LMP 07/19/2011  Physical Exam  Constitutional: She is oriented to person, place, and time. She appears well-developed and well-nourished.  HENT:  Head: Normocephalic and atraumatic.  Eyes: EOM are normal.  Neck: Normal range of motion.  Pulmonary/Chest: Effort normal.  Abdominal: Soft. Bowel sounds are normal. There is tenderness in the right lower quadrant.    Neurological: She is alert and oriented to person, place, and time.  Skin: Skin is warm and dry.    ED Course  Procedures (including critical care time)  Labs Reviewed - No data to display No results found.   1. Abdominal abscess       MDM          Richardo Priest, MD 07/23/11 1745

## 2011-07-23 NOTE — ED Notes (Signed)
Seen here and sent to ED for abscess rt abdomen.  Here today for recheck.  States it is still painful.  Reports she is taking antibiotics and pain medication as prescribed.

## 2011-07-24 LAB — WOUND CULTURE: Gram Stain: NONE SEEN

## 2011-07-24 NOTE — ED Provider Notes (Signed)
Medical screening examination/treatment/procedure(s) were conducted as a shared visit with non-physician practitioner(s) and myself.  I personally evaluated the patient during the encounter.  40yf with lesion r abdominal wall consistent with abscess and mild surrounding cellulitis. Small area where seems to be coming to head but no active drainage noted. Bedside US done and appeared to be small fluid collection which appeared to track up to skin surface. Discussed with PA. Plan I&D with abx for celulitis and wound recheck.  Raeford Razor, MD 07/24/11 [phone]

## 2011-07-24 NOTE — ED Notes (Signed)
Results called from [facility].  Abdominal wall Abscess -> Abundant MRSA.  11/24 Rx for Sulfa Trimeth -> sens to the same.  11/27 Rx cephalexin -> no sens for this drug provided.  Call and notify pt.  General FYI for MRSA placed.

## 2011-07-24 NOTE — ED Notes (Signed)
Patient informed of positive results and educated to MRSA precautions. 

## 2011-07-25 ENCOUNTER — Ambulatory Visit (INDEPENDENT_AMBULATORY_CARE_PROVIDER_SITE_OTHER): Payer: BC Managed Care – PPO | Admitting: Internal Medicine

## 2011-07-25 ENCOUNTER — Ambulatory Visit: Payer: BC Managed Care – PPO | Admitting: Internal Medicine

## 2011-07-25 ENCOUNTER — Encounter: Payer: Self-pay | Admitting: Internal Medicine

## 2011-07-25 ENCOUNTER — Emergency Department (HOSPITAL_COMMUNITY)
Admission: EM | Admit: 2011-07-25 | Discharge: 2011-07-25 | Disposition: A | Discharge status: Home / Self Care | Payer: BC Managed Care – PPO | Attending: Emergency Medicine | Admitting: Emergency Medicine

## 2011-07-25 ENCOUNTER — Encounter (HOSPITAL_COMMUNITY): Payer: Self-pay | Admitting: *Deleted

## 2011-07-25 VITALS — BP 124/80 | HR 96 | Temp 99.1°F | Resp 20 | Wt 210.8 lb

## 2011-07-25 DIAGNOSIS — R5381 Other malaise: Secondary | ICD-10-CM | POA: Insufficient documentation

## 2011-07-25 DIAGNOSIS — R51 Headache: Secondary | ICD-10-CM | POA: Insufficient documentation

## 2011-07-25 DIAGNOSIS — J069 Acute upper respiratory infection, unspecified: Secondary | ICD-10-CM | POA: Insufficient documentation

## 2011-07-25 DIAGNOSIS — R5383 Other fatigue: Secondary | ICD-10-CM | POA: Insufficient documentation

## 2011-07-25 DIAGNOSIS — L03319 Cellulitis of trunk, unspecified: Secondary | ICD-10-CM

## 2011-07-25 DIAGNOSIS — L02211 Cutaneous abscess of abdominal wall: Secondary | ICD-10-CM | POA: Insufficient documentation

## 2011-07-25 DIAGNOSIS — R509 Fever, unspecified: Secondary | ICD-10-CM | POA: Insufficient documentation

## 2011-07-25 LAB — DIFFERENTIAL
Basophils Absolute: 0 10*3/uL (ref 0.0–0.1)
Lymphocytes Relative: 16 % (ref 12–46)
Lymphs Abs: 1.2 10*3/uL (ref 0.7–4.0)
Neutrophils Relative %: 76 % (ref 43–77)

## 2011-07-25 LAB — URINALYSIS, ROUTINE W REFLEX MICROSCOPIC
Glucose, UA: NEGATIVE mg/dL
Ketones, ur: NEGATIVE mg/dL
Nitrite: NEGATIVE
Specific Gravity, Urine: 1.015 (ref 1.005–1.030)
pH: 6 (ref 5.0–8.0)

## 2011-07-25 LAB — HEPATIC FUNCTION PANEL
AST: 13 U/L (ref 0–37)
Albumin: 3.7 g/dL (ref 3.5–5.2)
Alkaline Phosphatase: 99 U/L (ref 39–117)
Total Bilirubin: 0.1 mg/dL — ABNORMAL LOW (ref 0.3–1.2)
Total Protein: 8.4 g/dL — ABNORMAL HIGH (ref 6.0–8.3)

## 2011-07-25 LAB — POCT PREGNANCY, URINE: Preg Test, Ur: NEGATIVE

## 2011-07-25 LAB — POCT I-STAT, CHEM 8
BUN: 8 mg/dL (ref 6–23)
Creatinine, Ser: 0.8 mg/dL (ref 0.50–1.10)
Hemoglobin: 12.2 g/dL (ref 12.0–15.0)
Potassium: 4.1 mEq/L (ref 3.5–5.1)
Sodium: 137 mEq/L (ref 135–145)

## 2011-07-25 LAB — CBC
Platelets: 420 10*3/uL — ABNORMAL HIGH (ref 150–400)
RBC: 3.78 MIL/uL — ABNORMAL LOW (ref 3.87–5.11)
WBC: 7.6 10*3/uL (ref 4.0–10.5)

## 2011-07-25 LAB — URINE MICROSCOPIC-ADD ON

## 2011-07-25 MED ORDER — HYDROMORPHONE HCL PF 2 MG/ML IJ SOLN
INTRAMUSCULAR | Status: AC
  Filled 2011-07-25: qty 1

## 2011-07-25 MED ORDER — ACETAMINOPHEN 325 MG PO TABS
650.0000 mg | ORAL_TABLET | Freq: Once | ORAL | Status: AC
  Administered 2011-07-25: 650 mg via ORAL
  Filled 2011-07-25: qty 2

## 2011-07-25 MED ORDER — MORPHINE SULFATE 2 MG/ML IJ SOLN
INTRAMUSCULAR | Status: AC
  Administered 2011-07-25: 4 mg via INTRAVASCULAR
  Filled 2011-07-25: qty 2

## 2011-07-25 MED ORDER — ONDANSETRON 8 MG PO TBDP
8.0000 mg | ORAL_TABLET | Freq: Once | ORAL | Status: AC
  Administered 2011-07-25: 8 mg via ORAL
  Filled 2011-07-25: qty 1

## 2011-07-25 MED ORDER — MORPHINE SULFATE 4 MG/ML IJ SOLN: 4.0000 mg | Freq: Once | INTRAMUSCULAR | Status: DC

## 2011-07-25 MED ORDER — KETOROLAC TROMETHAMINE 30 MG/ML IJ SOLN
30.0000 mg | Freq: Once | INTRAMUSCULAR | Status: AC
  Administered 2011-07-25: 30 mg via INTRAVENOUS
  Filled 2011-07-25: qty 1

## 2011-07-25 NOTE — ED Notes (Signed)
Patient denies pain and is resting comfortably.  

## 2011-07-25 NOTE — Progress Notes (Signed)
  Subjective:    Patient ID: April Levine, female    DOB: [birth year], 40 y.o.   MRN: [id number]  HPI She returns for f/up after having an abscess on her right lower abdomen drained a few days ago and she tells me that she is not doing well with worsening pain, swelling, and drainage from the incision sight in her abd. Also she has developed headache and nausea and can't keep the antibiotics down. The clx results are positive for MRSA.   Review of Systems  Constitutional: Positive for fever, chills, diaphoresis, appetite change and fatigue. Negative for activity change and unexpected weight change.  Eyes: Negative.   Respiratory: Negative for shortness of breath, wheezing and stridor.   Cardiovascular: Negative for chest pain, palpitations and leg swelling.  Gastrointestinal: Negative for nausea, vomiting, abdominal pain and diarrhea.  Genitourinary: Negative for dysuria, urgency, frequency, hematuria, decreased urine volume, enuresis, difficulty urinating and dyspareunia.  Musculoskeletal: Negative for myalgias, back pain, joint swelling, arthralgias and gait problem.  Skin: Positive for wound. Negative for color change, pallor and rash.  Neurological: Positive for dizziness, weakness, light-headedness and headaches. Negative for tremors, seizures, syncope, facial asymmetry, speech difficulty and numbness.  Hematological: Negative for adenopathy. Does not bruise/bleed easily.  Psychiatric/Behavioral: Negative.        Objective:   Physical Exam  Constitutional: She appears lethargic. She appears toxic. She has a sickly appearance. She appears ill. She appears distressed.  Eyes: Conjunctivae are normal. Right eye exhibits no discharge. Left eye exhibits no discharge. No scleral icterus.  Neck: Normal range of motion. Neck supple. No JVD present. No tracheal deviation present. No thyromegaly present.  Cardiovascular: Normal rate, regular rhythm, normal heart sounds and intact distal pulses.   Exam reveals no gallop and no friction rub.   No murmur heard. Pulmonary/Chest: Effort normal and breath sounds normal. No respiratory distress. She has no wheezes. She has no rales. She exhibits no tenderness.  Abdominal: Soft. Bowel sounds are normal. She exhibits no distension and no mass. There is no hepatosplenomegaly. There is tenderness. There is no rebound and no guarding.    Musculoskeletal: Normal range of motion. She exhibits no edema and no tenderness.  Lymphadenopathy:    She has no cervical adenopathy.  Neurological: She appears lethargic.  Skin: Skin is warm and dry. No rash noted. There is erythema. No pallor.          Assessment & Plan:

## 2011-07-25 NOTE — ED Notes (Signed)
MD to bedside.

## 2011-07-25 NOTE — ED Notes (Signed)
Mother states "she had an abcess drained @ [facility], went to Urgent Care for the recheck on Wed, has had a h/a since last night, pain in abdomen remains the same"; paperwork from MD's office indicates pt + for MRSA

## 2011-07-25 NOTE — ED Notes (Signed)
Pt given discharge instructions and verbalizes understanding  

## 2011-07-25 NOTE — Assessment & Plan Note (Signed)
I am concerned that she has a persistent collection of abscess and possible bacteremia/sepsis, I think she should be sent to the ER for IV antibiotics and possibly a CT scan to see if there is an additional area that needs I and D. She has family with her today.

## 2011-07-26 NOTE — ED Provider Notes (Signed)
History     CSN: [id number] Arrival date & time: 07/25/2011  3:31 PM   First MD Initiated Contact with Patient 07/25/11 1610      Chief Complaint  Patient presents with  . Fever  . Chills    (Consider location/radiation/quality/duration/timing/severity/associated sxs/prior treatment) HPI Patient is a 40 year old female who presents today complaining of headache, nasal congestion, and fatigue. She later complained of abdominal pain to nursing staff that she denied this to me. Patient said her pain was a 10 out of 10. She endorses fever and chills at home. Patient was not coughing. Patient has been seen multiple times in the emergency department recently for an abscess on her right side. This is improving. It is no longer draining and waking has been removed. Patient is continuing on Bactrim and Keflex for this. She denies nausea or vomiting. She denies constipation, diarrhea, urinary symptoms, or vaginal discharge. There are no other associated or modifying factors. Past Medical History  Diagnosis Date  . Anemia   . Anemia     Past Surgical History  Procedure Date  . Knee arthroscopy     Family History  Problem Relation Age of Onset  . Alcohol abuse Other     History  Substance Use Topics  . Smoking status: Never Smoker   . Smokeless tobacco: Not on file  . Alcohol Use: Yes     ocassionally    OB History    Grav Para Term Preterm Abortions TAB SAB Ect Mult Living                  Review of Systems  Constitutional: Positive for chills and fatigue.  HENT: Positive for congestion, rhinorrhea and sinus pressure.   Eyes: Negative.   Respiratory: Positive for cough.   Cardiovascular: Negative.   Gastrointestinal: Positive for abdominal pain.  Genitourinary: Negative.   Musculoskeletal: Negative.   Skin: Positive for wound.  Neurological: Positive for headaches.  Hematological: Negative.   Psychiatric/Behavioral: Negative.   All other systems reviewed and are  negative.    Allergies  Review of patient's allergies indicates no known allergies.  Home Medications   Current Outpatient Rx  Name Route Sig Dispense Refill  . CEPHALEXIN 500 MG PO CAPS Oral Take 1 capsule (500 mg total) by mouth 4 (four) times daily. 28 capsule 0  . HYDROCODONE-ACETAMINOPHEN 5-325 MG PO TABS  Take one to two tablets every 4 to 6 hours as needed for pain 20 tablet 0  . SULFAMETHOXAZOLE-TRIMETHOPRIM 800-160 MG PO TABS Oral Take 1 tablet by mouth 2 (two) times daily. 14 tablet 0    BP 107/66  Pulse 84  Temp(Src) 100.1 F (37.8 C) (Oral)  Resp 16  Wt 210 lb (95.255 kg)  SpO2 100%  LMP 07/19/2011  Physical Exam  Nursing note and vitals reviewed. Constitutional: She is oriented to person, place, and time. She appears well-developed and well-nourished. No distress.       Uncomfortable appearing  HENT:  Head: Normocephalic and atraumatic.       Nasal congestion noted. Oropharynx is erythematous but symmetric  Eyes: Conjunctivae and EOM are normal. Pupils are equal, round, and reactive to light.  Neck: Normal range of motion.  Cardiovascular: Normal rate, regular rhythm, normal heart sounds and intact distal pulses.  Exam reveals no gallop and no friction rub.   No murmur heard. Pulmonary/Chest: Effort normal and breath sounds normal. No respiratory distress. She has no wheezes. She has no rales.  Abdominal: Soft. Bowel sounds  are normal. She exhibits no distension. There is no tenderness. There is no rebound and no guarding.  Musculoskeletal: Normal range of motion. She exhibits no edema and no tenderness.  Neurological: She is alert and oriented to person, place, and time. No cranial nerve deficit. She exhibits normal muscle tone. Coordination normal.  Skin: Skin is warm and dry. No erythema.        Small 1 cm wound from prior incision and drainage noted on the patient's right side. This is not tender to palpation. There is no drainage on palpation. There is still  some induration surrounding this.  Psychiatric: She has a normal mood and affect.    ED Course  Procedures (including critical care time)  Labs Reviewed  CBC - Abnormal; Notable for the following:    RBC 3.78 (*)    Hemoglobin 10.4 (*)    HCT 33.5 (*)    Platelets 420 (*)    All other components within normal limits  POCT I-STAT, CHEM 8 - Abnormal; Notable for the following:    Glucose, Bld 105 (*)    All other components within normal limits  HEPATIC FUNCTION PANEL - Abnormal; Notable for the following:    Total Protein 8.4 (*)    Total Bilirubin 0.1 (*)    All other components within normal limits  URINALYSIS, ROUTINE W REFLEX MICROSCOPIC - Abnormal; Notable for the following:    APPearance CLOUDY (*)    Leukocytes, UA MODERATE (*)    All other components within normal limits  URINE MICROSCOPIC-ADD ON - Abnormal; Notable for the following:    Squamous Epithelial / LPF FEW (*)    Bacteria, UA FEW (*)    All other components within normal limits  DIFFERENTIAL  LIPASE, BLOOD  POCT PREGNANCY, URINE   No results found.   1. Acute URI       MDM   Based on my evaluation the patient had normal vital signs initially. Her prior wound showed no tenderness to palpation concerning for worsening of cellulitis or return of her abscess. Patient was still taking antibiotics. Though her Bactrim dose was only 1 tab twice a day the patient still appeared to be improving in her wound was almost healed. Patient's family was very concerned about possible spread of infection because of this. Patient did have a CBC and renal panel for this reason. Both were unremarkable. Patient also had a urinalysis as well as hepatic function and lipase following complaint of abdominal pain the nursing staff. She was treated here initially with Tylenol as well as oral Zofran. She tolerated both of these well. Urinalysis did show 7-10 white blood cells a few squamous epithelia. Her urine was leukoesterase  positive but nitrite negative. This is most likely felt to be mild contamination. Patient was artery being treated with Keflex which also treat a urinary tract infection. Patient was notified of results and was given a dose of Toradol 30 mg IV. Patient family were couple of plan for discharge home. I think symptoms are likely related to a viral URI. Patient was told that symptoms may persist for 7-10 days. She is welcome to return if she has any other emergent concerns. She was told to take her entire course of antibiotics.        Cyndra Numbers, MD 07/26/11 [phone]

## 2011-07-28 ENCOUNTER — Telehealth: Payer: Self-pay | Admitting: *Deleted

## 2011-07-28 ENCOUNTER — Emergency Department (HOSPITAL_COMMUNITY): Payer: BC Managed Care – PPO

## 2011-07-28 ENCOUNTER — Encounter (HOSPITAL_COMMUNITY): Payer: Self-pay | Admitting: *Deleted

## 2011-07-28 ENCOUNTER — Emergency Department (HOSPITAL_COMMUNITY)
Admission: EM | Admit: 2011-07-28 | Discharge: 2011-07-28 | Disposition: A | Discharge status: Home / Self Care | Payer: BC Managed Care – PPO | Attending: Emergency Medicine | Admitting: Emergency Medicine

## 2011-07-28 DIAGNOSIS — L02211 Cutaneous abscess of abdominal wall: Secondary | ICD-10-CM

## 2011-07-28 DIAGNOSIS — L0291 Cutaneous abscess, unspecified: Secondary | ICD-10-CM | POA: Insufficient documentation

## 2011-07-28 DIAGNOSIS — Z9889 Other specified postprocedural states: Secondary | ICD-10-CM | POA: Insufficient documentation

## 2011-07-28 DIAGNOSIS — K4020 Bilateral inguinal hernia, without obstruction or gangrene, not specified as recurrent: Secondary | ICD-10-CM | POA: Insufficient documentation

## 2011-07-28 DIAGNOSIS — L0390 Cellulitis, unspecified: Secondary | ICD-10-CM | POA: Insufficient documentation

## 2011-07-28 MED ORDER — IOHEXOL 300 MG/ML  SOLN
100.0000 mL | Freq: Once | INTRAMUSCULAR | Status: AC | PRN
  Administered 2011-07-28: 100 mL via INTRAVENOUS

## 2011-07-28 NOTE — Telephone Encounter (Signed)
Pt c/o her MRSA site is "no longer draining in stomach-Urgent Care pulled out drain-still more fluid/bacteria inside of wound that can't come out due to drain closed up". Also that last time she was in office she went to ER to have procedure done and that they only treated her for Flu & D/C-no longer has flu Sxs but doesn't have any Rxs left for MRSA Tx.  Please advise.

## 2011-07-28 NOTE — Telephone Encounter (Signed)
Patient states she will get to [facility] ER in about an hour [has to get coverage at work] and is concerned about last trip to ED [Fri 11.30.12] when she was only Tx for Flu/URI and MRSA was not addressed.

## 2011-07-28 NOTE — Telephone Encounter (Signed)
Called [facility] ED and spoke w/charge nurse to inform on MDs instructions; she would like to inform us that they are on a [3] hr wait delay just to triage.

## 2011-07-28 NOTE — ED Provider Notes (Signed)
Medical screening examination/treatment/procedure(s) were conducted as a shared visit with non-physician practitioner(s) and myself.  I personally evaluated the patient during the encounter  Toy Baker, MD 07/28/11 2250

## 2011-07-28 NOTE — ED Provider Notes (Signed)
Sent Friday to ED, and only treated for flu. Spoke with Dr. Yetta Barre,  Patient's PCP. States He sent her here specifically for see a surgeon have abscess drained which he feels is deep loculations underneath scar tissue. Patient has a large indurated 2 X 8cmlesion with a small 1cm closed incision. Mildly tender to palpation. Patient states it does not hurt to palpation but she became concerned when the incision closed and wound stopped draining  April Levine, [place] 07/28/11 1702

## 2011-07-28 NOTE — Telephone Encounter (Signed)
I agree. Go back to the ER and ask them to see if there is more abscess that needs to be drained

## 2011-07-28 NOTE — Telephone Encounter (Signed)
She needs to see a surgeon asap

## 2011-07-28 NOTE — ED Provider Notes (Addendum)
History     CSN: [id number] Arrival date & time: 07/28/2011  4:12 PM   First MD Initiated Contact with Patient 07/28/11 2207      Chief Complaint  Patient presents with  . Abscess    pt reports Dr. Yetta Barre at [place] told pt to come to ER to see surgeon, due to closed abscess.     (Consider location/radiation/quality/duration/timing/severity/associated sxs/prior treatment) Patient is a 40 y.o. female presenting with abscess. The history is provided by the patient.  Abscess    Patient here with swelling to her right mid lateral abdomen area. Patient was diagnosed with having abscess with infection and is taking antibiotics for this. She spoke with her Dr. today who sent her to the ER for evaluation of possible need for surgical drainage. No fever or vomiting. Nothing makes symptoms better or worse. She denies any diarrhea. No extension of the rash at this time. Past Medical History  Diagnosis Date  . Anemia   . Anemia     Past Surgical History  Procedure Date  . Knee arthroscopy     Family History  Problem Relation Age of Onset  . Alcohol abuse Other     History  Substance Use Topics  . Smoking status: Never Smoker   . Smokeless tobacco: Not on file  . Alcohol Use: Yes     ocassionally    OB History    Grav Para Term Preterm Abortions TAB SAB Ect Mult Living                  Review of Systems  All other systems reviewed and are negative.    Allergies  Review of patient's allergies indicates no known allergies.  Home Medications   Current Outpatient Rx  Name Route Sig Dispense Refill  . CEPHALEXIN 500 MG PO CAPS Oral Take 1 capsule (500 mg total) by mouth 4 (four) times daily. 28 capsule 0  . HYDROCODONE-ACETAMINOPHEN 5-325 MG PO TABS  Take one to two tablets every 4 to 6 hours as needed for pain 20 tablet 0    BP 133/85  Pulse 104  Temp(Src) 97.5 F (36.4 C) (Oral)  Resp 20  Wt 210 lb (95.255 kg)  SpO2 100%  LMP 07/19/2011  Physical Exam    Nursing note and vitals reviewed. Constitutional: She is oriented to person, place, and time. She appears well-developed and well-nourished.  Non-toxic appearance. No distress.  HENT:  Head: Normocephalic and atraumatic.  Eyes: Conjunctivae, EOM and lids are normal. Pupils are equal, round, and reactive to light.  Neck: Normal range of motion. Neck supple. No tracheal deviation present. No mass present.  Cardiovascular: Normal rate, regular rhythm and normal heart sounds.  Exam reveals no gallop.   No murmur heard. Pulmonary/Chest: Effort normal and breath sounds normal. No stridor. No respiratory distress. She has no decreased breath sounds. She has no wheezes. She has no rhonchi. She has no rales.  Abdominal: Soft. Normal appearance and bowel sounds are normal. She exhibits no distension. There is no tenderness. There is no rebound and no CVA tenderness.    Musculoskeletal: Normal range of motion. She exhibits no edema and no tenderness.  Neurological: She is alert and oriented to person, place, and time. She has normal strength. No cranial nerve deficit or sensory deficit. GCS eye subscore is 4. GCS verbal subscore is 5. GCS motor subscore is 6.  Skin: Skin is warm and dry. No abrasion and no rash noted. Rash is not papular and  not vesicular.  Psychiatric: She has a normal mood and affect. Her speech is normal and behavior is normal.    ED Course  Procedures (including critical care time)  Labs Reviewed - No data to display Ct Abdomen Pelvis W Contrast  07/28/2011  *RADIOLOGY REPORT*  Clinical Data: Status post right abdominal abscess drain removal. The patient is concerned about residual/reaccumulating abscess.  CT ABDOMEN AND PELVIS WITH CONTRAST  Technique:  Multidetector CT imaging of the abdomen and pelvis was performed following the standard protocol during bolus administration of intravenous contrast.  Contrast: OMNIPAQUE IOHEXOL 300 MG/ML IV SOLN  Comparison: None.   Findings: Mild soft tissue stranding in the right lateral subcutaneous fat.  No fluid collections are seen.  The underlying intra-abdominal contents have normal appearances.  Normal appearing liver, spleen, pancreas, gallbladder, adrenal glands, kidneys urinary bladder and ovaries.  Intrauterine device within the endometrial cavity.  Bilateral inguinal hernias containing fat.  Clear lung bases.  Unremarkable bones.  IMPRESSION:  1.  Mild right lateral subcutaneous soft tissue stranding without abscess. 2.  Bilateral inguinal hernias containing fat.  Original Report Authenticated By: Darrol Angel, M.D.     No diagnosis found.    MDM  Results of CAT scan discussed with patient. No signs of abscess by CT at this time. Patient to continue taking her antibiotics for suspected cellulitis.        Toy Baker, MD 07/28/11 0454  Toy Baker, MD 07/28/11 [id number]

## 2011-07-29 ENCOUNTER — Encounter: Payer: Self-pay | Admitting: Internal Medicine

## 2011-07-29 ENCOUNTER — Ambulatory Visit (INDEPENDENT_AMBULATORY_CARE_PROVIDER_SITE_OTHER): Payer: BC Managed Care – PPO | Admitting: Internal Medicine

## 2011-07-29 VITALS — BP 100/70 | HR 76 | Temp 97.3°F | Resp 16 | Wt 209.0 lb

## 2011-07-29 DIAGNOSIS — L02219 Cutaneous abscess of trunk, unspecified: Secondary | ICD-10-CM

## 2011-07-29 DIAGNOSIS — L03319 Cellulitis of trunk, unspecified: Secondary | ICD-10-CM

## 2011-07-29 DIAGNOSIS — L02211 Cutaneous abscess of abdominal wall: Secondary | ICD-10-CM

## 2011-07-29 MED ORDER — SULFAMETHOXAZOLE-TMP DS 800-160 MG PO TABS: 1.0000 | ORAL_TABLET | Freq: Two times a day (BID) | ORAL | Status: AC

## 2011-07-29 NOTE — Progress Notes (Signed)
  Subjective:    Patient ID: April Levine, female    DOB: 1971/01/20, 40 y.o.   MRN: [id number]  HPI She went back to the ER yesterday and a CT scan showed some "stranding" but no persistent abscess. The would that was previously created has closed but she feels some pain and swelling in that area.  Review of Systems  Constitutional: Negative for fever, chills, diaphoresis, activity change, appetite change, fatigue and unexpected weight change.  HENT: Negative.   Eyes: Negative.   Respiratory: Negative for cough, shortness of breath, wheezing and stridor.   Cardiovascular: Negative for chest pain, palpitations and leg swelling.  Gastrointestinal: Negative for nausea, vomiting, abdominal pain, diarrhea, constipation and abdominal distention.  Genitourinary: Negative for dysuria, urgency, frequency, hematuria, flank pain, enuresis, difficulty urinating and dyspareunia.  Musculoskeletal: Negative for myalgias, back pain, joint swelling, arthralgias and gait problem.  Skin: Positive for wound. Negative for color change, pallor and rash.  Neurological: Negative.   Hematological: Negative for adenopathy. Does not bruise/bleed easily.  Psychiatric/Behavioral: Negative.        Objective:   Physical Exam  Vitals reviewed. Constitutional: She is oriented to person, place, and time. She appears well-developed and well-nourished. No distress.  HENT:  Head: Normocephalic and atraumatic.  Mouth/Throat: Oropharynx is clear and moist. No oropharyngeal exudate.  Eyes: Conjunctivae are normal. Right eye exhibits no discharge. Left eye exhibits no discharge. No scleral icterus.  Neck: Normal range of motion. Neck supple. No JVD present. No tracheal deviation present. No thyromegaly present.  Cardiovascular: Normal rate, regular rhythm, normal heart sounds and intact distal pulses.  Exam reveals no gallop and no friction rub.   No murmur heard. Pulmonary/Chest: Effort normal and breath sounds normal.  No stridor. No respiratory distress. She has no wheezes. She has no rales. She exhibits no tenderness.  Abdominal: Soft. Bowel sounds are normal. She exhibits no distension. There is no hepatosplenomegaly. There is no tenderness. There is no rebound, no guarding and no CVA tenderness.    Musculoskeletal: Normal range of motion. She exhibits no edema and no tenderness.  Lymphadenopathy:    She has no cervical adenopathy.  Neurological: She is oriented to person, place, and time.  Skin: Skin is warm and dry. No rash noted. She is not diaphoretic. No erythema. No pallor.  Psychiatric: She has a normal mood and affect. Her behavior is normal. Judgment and thought content normal.     Lab Results  Component Value Date   WBC 7.6 07/25/2011   HGB 12.2 07/25/2011   HCT 36.0 07/25/2011   PLT 420* 07/25/2011   GLUCOSE 105* 07/25/2011   ALT 8 07/25/2011   AST 13 07/25/2011   NA 137 07/25/2011   K 4.1 07/25/2011   CL 103 07/25/2011   CREATININE 0.80 07/25/2011   BUN 8 07/25/2011   CO2 27 12/30/2010   TSH 0.95 12/30/2010       Assessment & Plan:

## 2011-07-29 NOTE — Assessment & Plan Note (Signed)
The abscess has resolved but I am concerned that she may have some smoldering MRSA infection and a risk of abscess reformation so I have asked her to complete a 14 day course of bactrim-ds

## 2011-07-29 NOTE — Patient Instructions (Signed)

## 2011-07-31 NOTE — Telephone Encounter (Signed)
TLJ spoke w/PA at [facility] ED after multiple conversations between triage & nursing on 12.03.12

## 2011-08-07 ENCOUNTER — Telehealth: Payer: Self-pay | Admitting: *Deleted

## 2011-08-07 ENCOUNTER — Encounter: Payer: Self-pay | Admitting: Endocrinology

## 2011-08-07 ENCOUNTER — Ambulatory Visit (INDEPENDENT_AMBULATORY_CARE_PROVIDER_SITE_OTHER): Payer: BC Managed Care – PPO | Admitting: Endocrinology

## 2011-08-07 VITALS — BP 102/70 | HR 106 | Temp 100.2°F | Ht 67.5 in | Wt 209.0 lb

## 2011-08-07 DIAGNOSIS — J069 Acute upper respiratory infection, unspecified: Secondary | ICD-10-CM

## 2011-08-07 MED ORDER — ONDANSETRON HCL 4 MG PO TABS: 4.0000 mg | ORAL_TABLET | Freq: Three times a day (TID) | ORAL | Status: DC | PRN

## 2011-08-07 MED ORDER — HYDROCODONE-ACETAMINOPHEN 5-325 MG PO TABS: ORAL_TABLET | ORAL | Status: DC

## 2011-08-07 NOTE — Telephone Encounter (Signed)
Pt c/o sore throat & is unable to keep food down since yesterday, sinus congestion and cough-No fever. Would like to know if you could order Rx or does she need OV.?

## 2011-08-07 NOTE — Patient Instructions (Addendum)
i have sent a prescription to your pharmacy, for an anti-nausea pill.   Here is a prescription for a pain pill. Take tylenol, and drink plenty of fluids. I hope you feel better soon.  If you don't feel better by next week, please call back.

## 2011-08-07 NOTE — Telephone Encounter (Signed)
Is she still taking bactrim? This sounds like a tonsillar abscess

## 2011-08-07 NOTE — Progress Notes (Signed)
  Subjective:    Patient ID: April Levine, female    DOB: 03-30-1971, 40 y.o.   MRN: [id number]  HPI Pt states 1 day of slightly prod-quality cough in the chest, and assoc n/v.  She is on bactrim for a recent abscess of the abdominal wall. Past Medical History  Diagnosis Date  . Anemia   . Anemia     Past Surgical History  Procedure Date  . Knee arthroscopy     History   Social History  . Marital Status: Single    Spouse Name: N/A    Number of Children: N/A  . Years of Education: N/A   Occupational History  . Not on file.   Social History Main Topics  . Smoking status: Never Smoker   . Smokeless tobacco: Not on file  . Alcohol Use: Yes     ocassionally  . Drug Use: No  . Sexually Active: Yes    Birth Control/ Protection: IUD     paraguard   Other Topics Concern  . Not on file   Social History Narrative   Regular exercise-Yes    Current Outpatient Prescriptions on File Prior to Visit  Medication Sig Dispense Refill  . sulfamethoxazole-trimethoprim (BACTRIM DS) 800-160 MG per tablet Take 1 tablet by mouth 2 (two) times daily.  28 tablet  1    No Known Allergies  Family History  Problem Relation Age of Onset  . Alcohol abuse Other     BP 102/70  Pulse 106  Temp(Src) 100.2 F (37.9 C) (Oral)  Ht 5' 7.5" (1.715 m)  Wt 209 lb (94.802 kg)  BMI 32.25 kg/m2  SpO2 99%  LMP 07/19/2011  Review of Systems Denies diarrhea, but she has fever.      Objective:   Physical Exam VITAL SIGNS:  See vs page GENERAL: no distress head: no deformity eyes: no periorbital swelling, no proptosis external nose and ears are normal mouth: no lesion seen LUNGS:  Clear to auscultation. ABDOMEN: abdomen is soft, nontender.  no hepatosplenomegaly.   not distended.  no hernia. Skin: at the right flank, there is hyperpigmentation, but no abscess.       Assessment & Plan:  Acute bronchitis, new. Abscess, better. Nausea, due to acute illness.

## 2011-08-08 NOTE — Telephone Encounter (Signed)
Seen Dr Everardo All 12.13.12--pain & nausea meds given--has started w/Bactrim again this morning. Will call back next week if not improving.

## 2011-08-13 ENCOUNTER — Encounter (INDEPENDENT_AMBULATORY_CARE_PROVIDER_SITE_OTHER): Payer: Self-pay | Admitting: General Surgery

## 2011-08-13 ENCOUNTER — Ambulatory Visit (INDEPENDENT_AMBULATORY_CARE_PROVIDER_SITE_OTHER): Payer: BC Managed Care – PPO | Admitting: General Surgery

## 2011-08-13 VITALS — BP 124/86 | HR 88 | Temp 97.6°F | Resp 16 | Ht 68.5 in | Wt 209.8 lb

## 2011-08-13 DIAGNOSIS — Z872 Personal history of diseases of the skin and subcutaneous tissue: Secondary | ICD-10-CM

## 2011-08-13 NOTE — Progress Notes (Signed)
Chief Complaint  Patient presents with  . Other    new pt- eval abd mass   PCP: Sanda Linger, MD, MD  HPI: 40 year old [place] American female referred by Dr. Yetta Barre for evaluation of a right abdominal abscess. The patient states that she developed a pimple on her right abdominal wall in late November. It came to a head and she popped it. It subsequently became red, swollen, and tender. She presented to the emergency room several times. She ultimately underwent incision and drainage on November 27th. She was placed on antibiotics. She underwent a CT scan a few days ago to evaluate for residual abscess. There is no residual fluid collection. There was just some mild stranding in her subcutaneous fat.  She denies any fevers or chills. She states that she recently developed the flu. She denies any tenderness on her abdominal wall. She states that it is occasionally sore. She finished her antibiotics yesterday. She denies any drainage or redness around the area.  Past medical, past surgical, family, and social history is reviewed  Physical exam: BP 124/86  Pulse 88  Temp(Src) 97.6 F (36.4 C) (Temporal)  Resp 16  Ht 5' 8.5" (1.74 m)  Wt 209 lb 12.8 oz (95.165 kg)  BMI 31.44 kg/m2  LMP 07/19/2011  Gen.-alert, oriented, female in no apparent distress Pulmonary-lungs are clear Cardiac-regular rate and rhythm Abdomen-soft, nontender, nondistended. Well-healed infraumbilical incision. Right lower quadrant lateral abdominal wall reveals a well-healed incision of approximately 1 cm. There is no surrounding erythema, cellulitis, fluctuance, or tenderness.  Data reviewed: I reviewed the CT scan and the ER notes.  Assessment and plan: Resolved right lateral abdominal wall abscess  We reviewed the CT scan. There is no residual abscess. Followup p.r.n.  Mary Sella. Andrey Campanile, MD, FACS General, Bariatric, & Minimally Invasive Surgery [facility], [place]

## 2011-12-29 ENCOUNTER — Encounter: Payer: Self-pay | Admitting: Endocrinology

## 2011-12-29 ENCOUNTER — Ambulatory Visit (INDEPENDENT_AMBULATORY_CARE_PROVIDER_SITE_OTHER): Payer: BC Managed Care – PPO | Admitting: Endocrinology

## 2011-12-29 VITALS — BP 112/70 | HR 78 | Temp 98.3°F | Ht 68.5 in | Wt 217.0 lb

## 2011-12-29 DIAGNOSIS — L089 Local infection of the skin and subcutaneous tissue, unspecified: Secondary | ICD-10-CM

## 2011-12-29 MED ORDER — DOXYCYCLINE HYCLATE 100 MG PO TABS: 100.0000 mg | ORAL_TABLET | Freq: Two times a day (BID) | ORAL | Status: AC

## 2011-12-29 NOTE — Patient Instructions (Signed)
i have sent a prescription to your pharmacy, for an antibiotic Putting a hot, wet towel against the area helps also. I hope you feel better soon.  If you don't feel better by next week, please call back.

## 2011-12-29 NOTE — Progress Notes (Signed)
  Subjective:    Patient ID: April Levine, female    DOB: 12-Jun-1971, 41 y.o.   MRN: [id number]  HPI Pt states few days of slight nodule at the skin of the left axilla, and assoc pain.  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   Past Medical History  Diagnosis Date  . Anemia   . Anemia     Past Surgical History  Procedure Date  . Knee arthroscopy     History   Social History  . Marital Status: Single    Spouse Name: N/A    Number of Children: N/A  . Years of Education: N/A   Occupational History  . Not on file.   Social History Main Topics  . Smoking status: Never Smoker   . Smokeless tobacco: Not on file  . Alcohol Use: No     ocassionally  . Drug Use: No  . Sexually Active: Yes    Birth Control/ Protection: IUD     paraguard   Other Topics Concern  . Not on file   Social History Narrative   Regular exercise-Yes    Current Outpatient Prescriptions on File Prior to Visit  Medication Sig Dispense Refill  . Ascorbic Acid (VITAMIN C PO) Take by mouth daily.        . fish oil-omega-3 fatty acids 1000 MG capsule Take 2 g by mouth daily.          No Known Allergies  Family History  Problem Relation Age of Onset  . Alcohol abuse Other     BP 112/70  Pulse 78  Temp(Src) 98.3 F (36.8 C) (Oral)  Ht 5' 8.5" (1.74 m)  Wt 217 lb (98.431 kg)  BMI 32.51 kg/m2  SpO2 96%  LMP 12/28/2011  Review of Systems Denies fever    Objective:   Physical Exam VITAL SIGNS:  See vs page GENERAL: no distress Left axilla:  1 cm diameter tender nodule, c/w folliculitis.     Assessment & Plan:  Pustule, new

## 2012-05-14 ENCOUNTER — Telehealth: Payer: Self-pay | Admitting: Obstetrics and Gynecology

## 2012-05-14 NOTE — Telephone Encounter (Signed)
Fax received for Rf Valtrex.  Annual due 04/2012. TC to pt. LM to return call.Will Rf x 1 month and have pt contact office to make appt.

## 2012-06-25 LAB — HM MAMMOGRAPHY: HM Mammogram: NORMAL

## 2012-07-24 IMAGING — CT CT ABD-PELV W/ CM
2 of 5 series · 17 of 46 positions shown, 19 images · IV contrast (APPLIED)
Comparison: None.

CLINICAL DATA: Status post right abdominal abscess drain removal.
The patient is concerned about residual/reaccumulating abscess.

CT ABDOMEN AND PELVIS WITH CONTRAST
TECHNIQUE: Multidetector CT imaging of the abdomen and pelvis was
performed following the standard protocol during bolus
administration of intravenous contrast.
Contrast: 100mL OMNIPAQUE IOHEXOL 300 MG/ML IV SOLN

[Series 2: abd/pel with · axial · 0.75mm/px · z∈[+1162,+1542]mm · 14 of 86 slices shown, 16 images]
[im 5/86  soft-tissue]
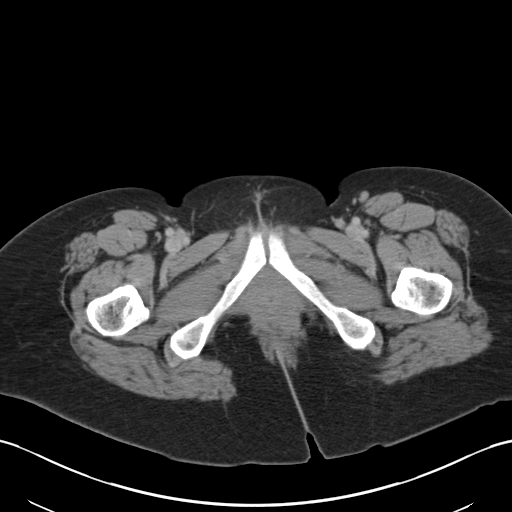
[im 5/86  bone]
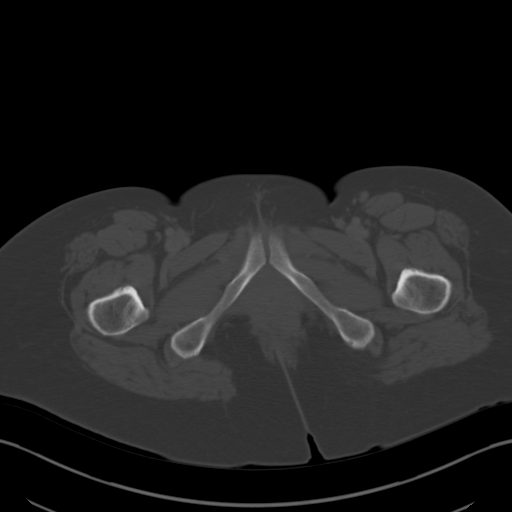
[im 9/86  soft-tissue]
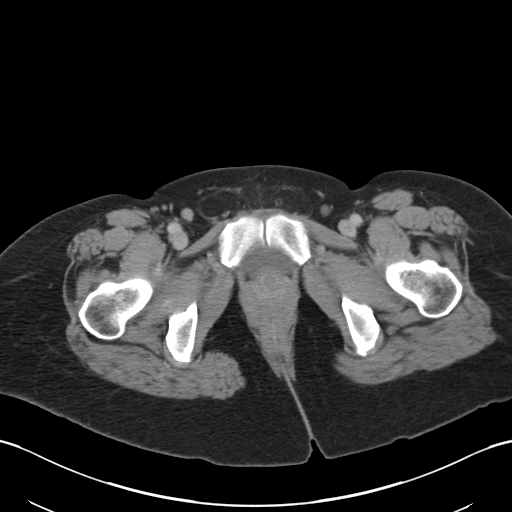
[im 18/86  soft-tissue]
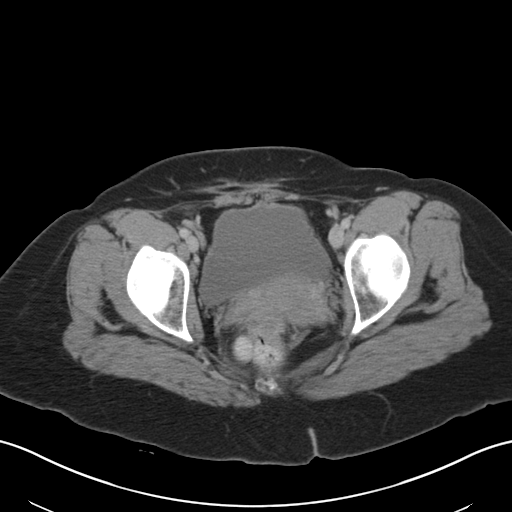
[im 23/86  soft-tissue]
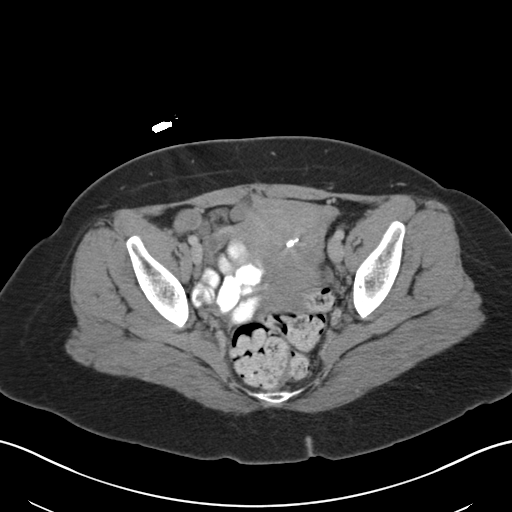
[im 27/86  soft-tissue]
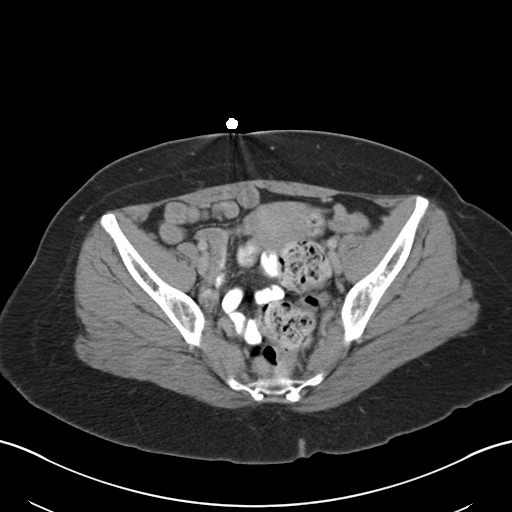
[im 36/86  soft-tissue]
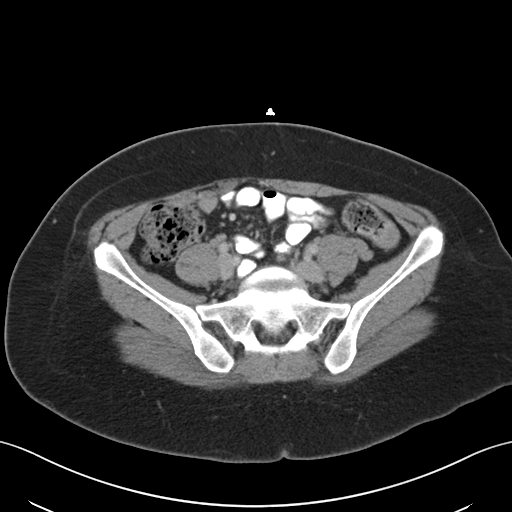
[im 41/86  soft-tissue]
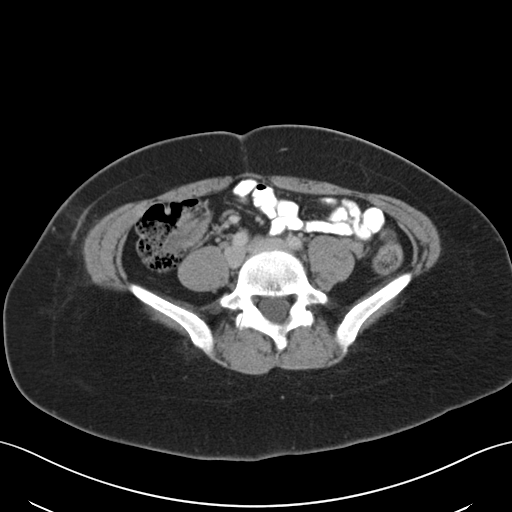
[im 45/86  soft-tissue]
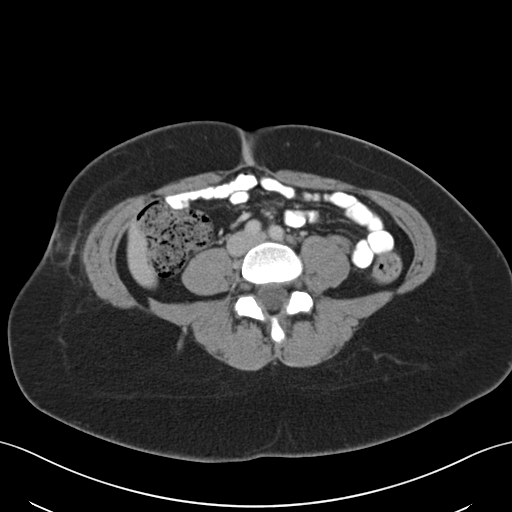
[im 50/86  soft-tissue]
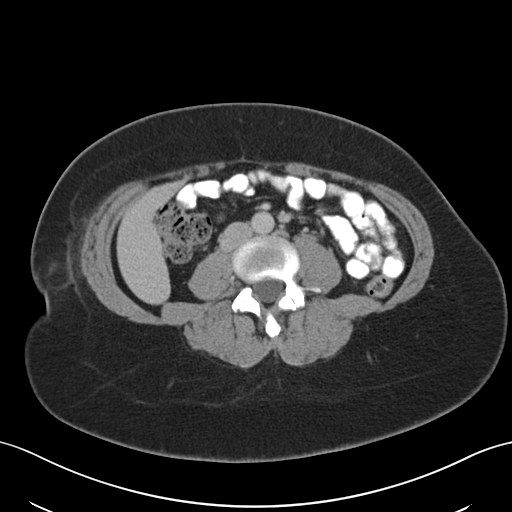
[im 50/86  bone]
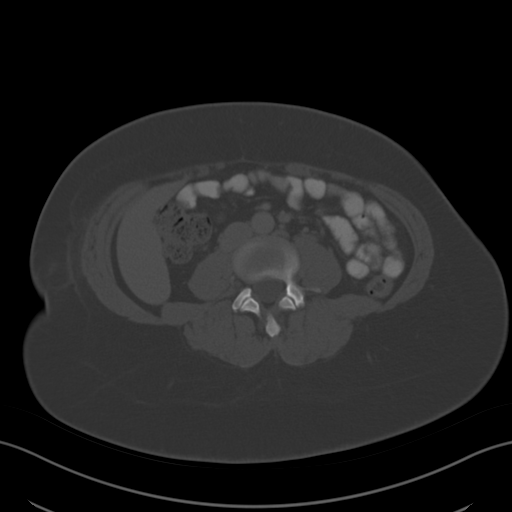
[im 59/86  soft-tissue]
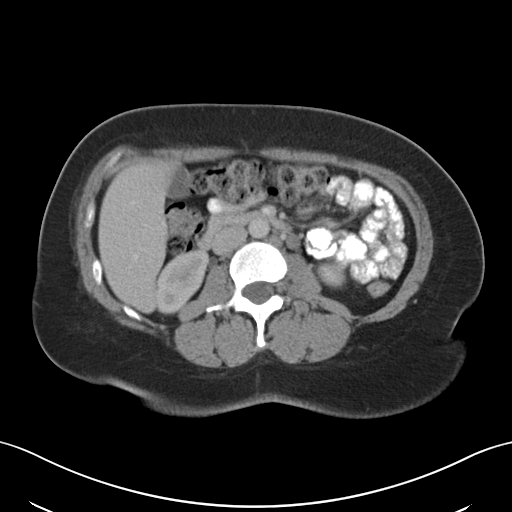
[im 63/86  soft-tissue]
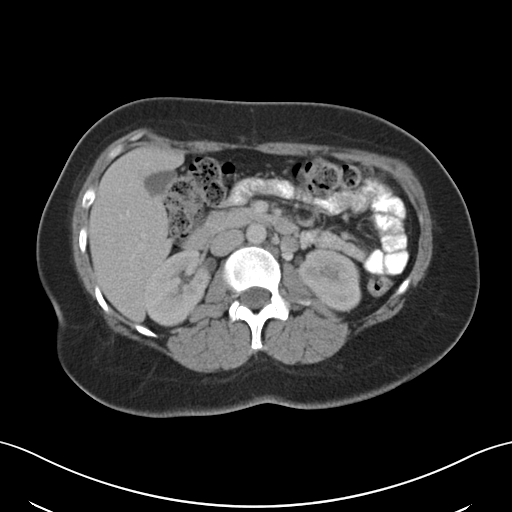
[im 68/86  soft-tissue]
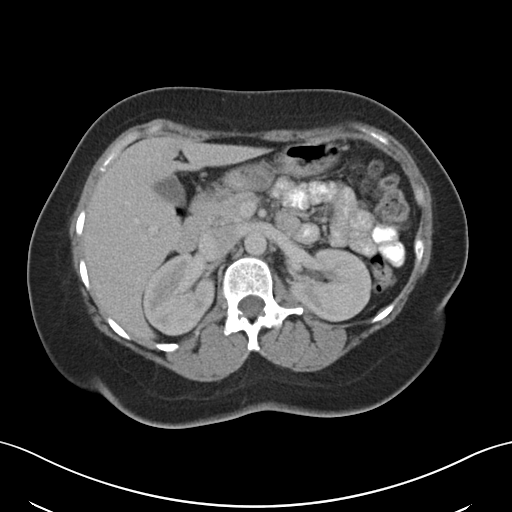
[im 77/86  soft-tissue]
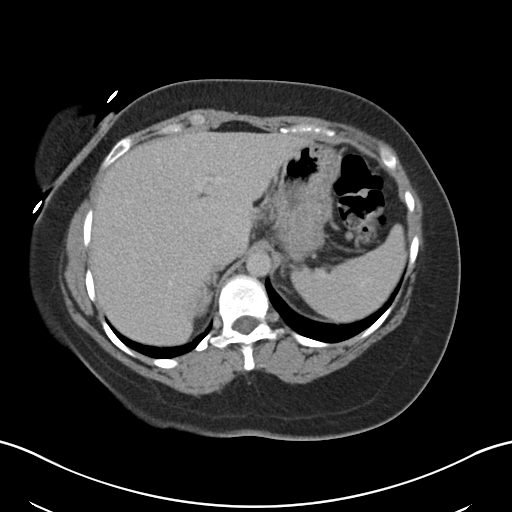
[im 81/86  soft-tissue]
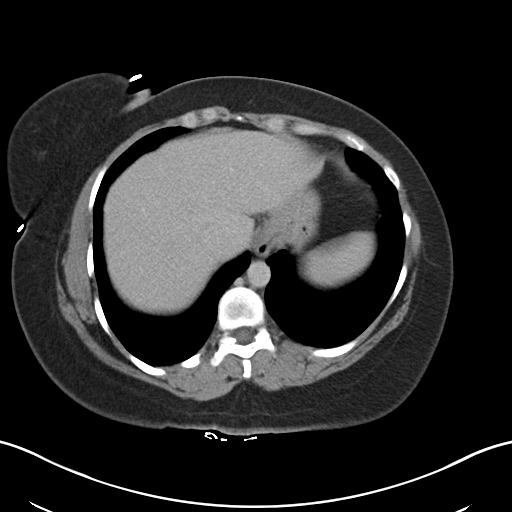

[Series 5: coronal · coronal · 0.83mm/px · 3 of 49 slices shown]
[im 17/49  soft-tissue]
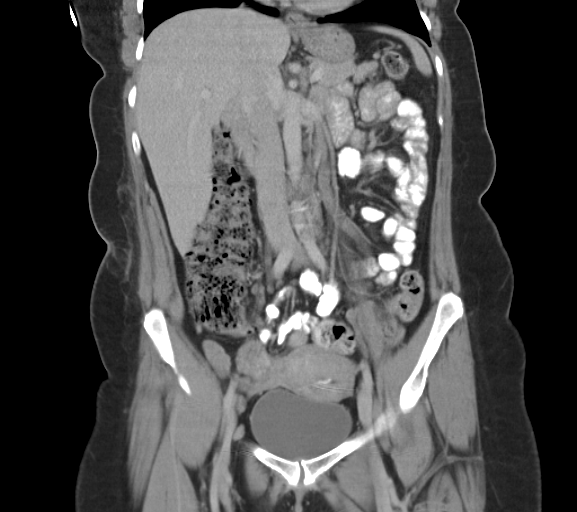
[im 22/49  soft-tissue]
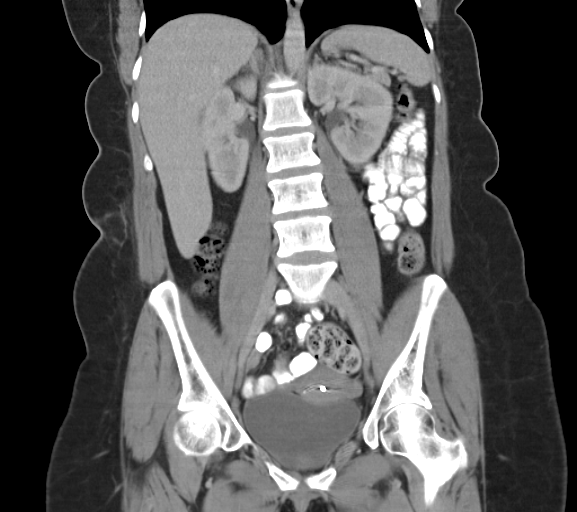
[im 27/49  soft-tissue]
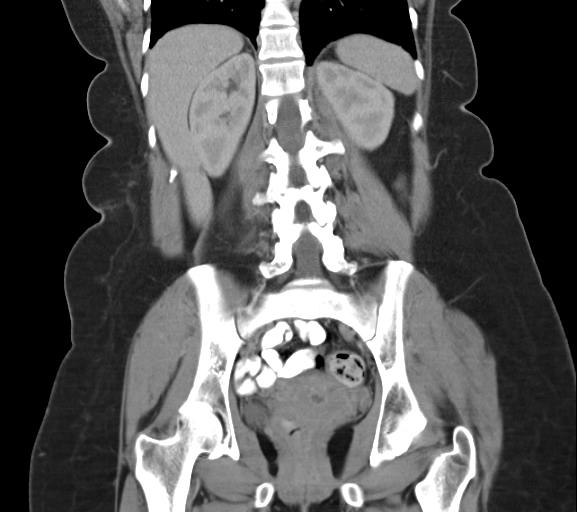

[17 of 46 positions shown; findings below may reference images not displayed]

FINDINGS: Mild soft tissue stranding in the right lateral
subcutaneous fat.  No fluid collections are seen.  The underlying
intra-abdominal contents have normal appearances.

Normal appearing liver, spleen, pancreas, gallbladder, adrenal
glands, kidneys urinary bladder and ovaries.  Intrauterine device
within the endometrial cavity.  Bilateral inguinal hernias
containing fat.

Clear lung bases.  Unremarkable bones.
IMPRESSION: 1.  Mild right lateral subcutaneous soft tissue stranding without
abscess.
2.  Bilateral inguinal hernias containing fat.

## 2013-03-15 ENCOUNTER — Telehealth: Payer: Self-pay | Admitting: Internal Medicine

## 2013-03-15 NOTE — Telephone Encounter (Signed)
Yes, this is fine with me.  

## 2013-03-15 NOTE — Telephone Encounter (Signed)
Pt would like to switch to Dr Fabian Sharp, is that ok with you Dr Yetta Barre? Pt would like switch to you Dr Fabian Sharp, is that ok?

## 2013-03-22 NOTE — Telephone Encounter (Signed)
Dr. Fabian Sharp, may I schedule this?

## 2013-03-23 NOTE — Telephone Encounter (Signed)
Yes

## 2013-03-24 NOTE — Telephone Encounter (Signed)
Scheduled

## 2013-06-15 ENCOUNTER — Encounter: Payer: Self-pay | Admitting: Internal Medicine

## 2013-06-15 ENCOUNTER — Ambulatory Visit (INDEPENDENT_AMBULATORY_CARE_PROVIDER_SITE_OTHER): Payer: BC Managed Care – PPO | Admitting: Internal Medicine

## 2013-06-15 VITALS — BP 106/66 | HR 67 | Temp 98.1°F | Ht 67.25 in | Wt 196.0 lb

## 2013-06-15 DIAGNOSIS — Z23 Encounter for immunization: Secondary | ICD-10-CM

## 2013-06-15 DIAGNOSIS — D649 Anemia, unspecified: Secondary | ICD-10-CM

## 2013-06-15 DIAGNOSIS — Z0000 Encounter for general adult medical examination without abnormal findings: Secondary | ICD-10-CM

## 2013-06-15 LAB — T4, FREE: Free T4: 0.91 ng/dL (ref 0.60–1.60)

## 2013-06-15 LAB — CBC WITH DIFFERENTIAL/PLATELET
Basophils Absolute: 0 10*3/uL (ref 0.0–0.1)
Eosinophils Relative: 2.6 % (ref 0.0–5.0)
HCT: 29 % — ABNORMAL LOW (ref 36.0–46.0)
Lymphocytes Relative: 45.8 % (ref 12.0–46.0)
Lymphs Abs: 1.9 10*3/uL (ref 0.7–4.0)
MCHC: 31.6 g/dL (ref 30.0–36.0)
Monocytes Relative: 9 % (ref 3.0–12.0)
Neutrophils Relative %: 41.9 % — ABNORMAL LOW (ref 43.0–77.0)
Platelets: 268 10*3/uL (ref 150.0–400.0)
RDW: 17.1 % — ABNORMAL HIGH (ref 11.5–14.6)
WBC: 4.2 10*3/uL — ABNORMAL LOW (ref 4.5–10.5)

## 2013-06-15 LAB — BASIC METABOLIC PANEL
BUN: 7 mg/dL (ref 6–23)
CO2: 26 mEq/L (ref 19–32)
Calcium: 9.1 mg/dL (ref 8.4–10.5)
Creatinine, Ser: 0.6 mg/dL (ref 0.4–1.2)
GFR: 130.85 mL/min (ref >60.00)
Glucose, Bld: 86 mg/dL (ref 70–99)
Potassium: 3.8 mEq/L (ref 3.5–5.1)

## 2013-06-15 LAB — LIPID PANEL
Cholesterol: 185 mg/dL (ref 0–200)
LDL Cholesterol: 97 mg/dL (ref 0–99)
Total CHOL/HDL Ratio: 3
VLDL: 16.6 mg/dL (ref 0.0–40.0)

## 2013-06-15 LAB — HEPATIC FUNCTION PANEL
AST: 17 U/L (ref 0–37)
Alkaline Phosphatase: 82 U/L (ref 39–117)
Bilirubin, Direct: 0 mg/dL (ref 0.0–0.3)
Total Bilirubin: 0.2 mg/dL — ABNORMAL LOW (ref 0.3–1.2)

## 2013-06-15 LAB — TSH: TSH: 0.74 u[IU]/mL (ref 0.35–5.50)

## 2013-06-15 NOTE — Progress Notes (Signed)
Chief Complaint  Patient presents with  . Establish Care    Pt would like lab work.  Is not fasting but has not had food since 6:30 am.    HPI: Patient comes in today to establish new patient visit  To this provider and wellness  Seen Dr Yetta Barre in past. Originally from [facility] after attending [facility].  medically issues ; Anemia from heavy periods at times  Limited sleep  2 jobs 2 children  18 and middle school.On call  For one job Hx dvt left preg and arthro knee surgery one episode  Has swelling near ankles predated  No rx  Generally well wants preventive check and assessment  ROS:  GEN/ HEENT: No fever, significant weight changes sweats headaches vision problems hearing changes, CV/ PULM; No chest pain shortness of breath cough, syncope,edema  change in exercise tolerance. GI /GU: No adominal pain, vomiting, change in bowel habits. No blood in the stool. No significant GU symptoms. Heavy periods  Ankles swell some since younger SKIN/HEME: ,no acute skin rashes suspicious lesions or bleeding. No lymphadenopathy, nodules, masses.  NEURO/ PSYCH:  No neurologic signs such as weakness numbness. No depression anxiety. IMM/ Allergy: No unusual infections.  Allergy .   REST of 12 system review negative except as per HPI   Past Medical History  Diagnosis Date  . Anemia   . Anemia   . Chicken pox   . MRSA (methicillin resistant staph aureus) culture positive   . DVT (deep vein thrombosis) in pregnancy 2003     and post surgery left leg  knee in preg    Family History  Problem Relation Age of Onset  . Alcohol abuse Other   . Arthritis Mother   . Hypertension Mother   . Dementia Father   . Alcohol abuse Maternal Grandmother   . Pulmonary embolism Mother   . Stroke Father     History   Social History  . Marital Status: Single    Spouse Name: N/A    Number of Children: N/A  . Years of Education: N/A   Social History Main Topics  . Smoking status: Never Smoker    . Smokeless tobacco: None  . Alcohol Use: No     Comment: ocassionally  . Drug Use: No  . Sexual Activity: Yes    Birth Control/ Protection: IUD     Comment: paraguard   Other Topics Concern  . None   Social History Narrative   Regular exercise-No   7-8 hours of sleep per night   3 people in the home  18 and 11    Has 1 dog.   Poly sci  Degree and and MBA    From [employer]  9-5    On call ups tracking    caffiene ocass    etoh    6 preg 2 Live births             Outpatient Encounter Prescriptions as of 06/15/2013  Medication Sig Dispense Refill  . [DISCONTINUED] Ascorbic Acid (VITAMIN C PO) Take by mouth daily.        . [DISCONTINUED] fish oil-omega-3 fatty acids 1000 MG capsule Take 2 g by mouth daily.         No facility-administered encounter medications on file as of 06/15/2013.    EXAM:  BP 106/66  Pulse 67  Temp(Src) 98.1 F (36.7 C) (Oral)  Ht 5' 7.25" (1.708 m)  Wt 196 lb (  88.905 kg)  BMI 30.48 kg/m2  SpO2 98%  LMP 04/25/2013  Body mass index is 30.48 kg/(m^2).  Physical Exam: Vital signs reviewed [id number] is a well-developed well-nourished alert cooperative   female who appears her stated age in no acute distress.  HEENT: normocephalic atraumatic , Eyes: PERRL EOM's full, conjunctiva clear, Nares: paten,t no deformity discharge or tenderness., Ears: no deformity EAC's clear TMs with normal landmarks. Mouth: clear OP, no lesions, edema.  Moist mucous membranes. Dentition in adequate repair. NECK: supple without masses, thyromegaly or bruits. CHEST/PULM:  Clear to auscultation and percussion breath sounds equal no wheeze , rales or rhonchi. No chest wall deformities or tenderness. Breast: normal by inspection . No dimpling, discharge, masses, tenderness or discharge . CV: PMI is nondisplaced, S1 S2 no gallops, murmurs, rubs. Peripheral pulses are full without delay.No JVD .  ABDOMEN: Bowel sounds normal nontender  No guard or rebound,  no hepato splenomegal no CVA tenderness.   Extremtities:  No clubbing cyanosis  Mild edema at ankles  No vv seen , no acute joint swelling or redness no focal atrophy NEURO:  Oriented x3, cranial nerves 3-12 appear to be intact, no obvious focal weakness,gait within normal limits no abnormal reflexes or asymmetrical SKIN: No acute rashes normal turgor, color, no bruising or petechiae. PSYCH: Oriented, good eye contact, no obvious depression anxiety, cognition and judgment appear normal. LN: no cervical axillary inguinal adenopathy  Lab Results  Component Value Date   WBC 4.2* 06/15/2013   HGB 9.2* 06/15/2013   HCT 29.0* 06/15/2013   PLT 268.0 06/15/2013   GLUCOSE 86 06/15/2013   CHOL 185 06/15/2013   TRIG 83.0 06/15/2013   HDL 71.80 06/15/2013   LDLCALC 97 06/15/2013   ALT 10 06/15/2013   AST 17 06/15/2013   NA 138 06/15/2013   K 3.8 06/15/2013   CL 104 06/15/2013   CREATININE 0.6 06/15/2013   BUN 7 06/15/2013   CO2 26 06/15/2013   TSH 0.74 06/15/2013    ASSESSMENT AND PLAN:  Discussed the following assessment and plan:  Visit for preventive health examination - Plan: HM MAMMOGRAPHY, HM PAP SMEAR, Flu Vaccine QUAD 36+ mos PF IM (Fluarix), Basic metabolic panel, CBC with Differential, Hepatic function panel, Lipid panel, TSH, T4, free  ANEMIA-NOS - hx of presumed iron defic not taking iron recenetly  - Plan: HM MAMMOGRAPHY, HM PAP SMEAR, Flu Vaccine QUAD 36+ mos PF IM (Fluarix), Basic metabolic panel, CBC with Differential, Hepatic function panel, Lipid panel, TSH, T4, free  Need for prophylactic vaccination and inoculation against influenza - Plan: HM MAMMOGRAPHY, HM PAP SMEAR, Flu Vaccine QUAD 36+ mos PF IM (Fluarix), Basic metabolic panel, CBC with Differential, Hepatic function panel, Lipid panel, TSH, T4, free  Patient Care Team: Madelin Headings, MD as PCP - General (Internal Medicine) Juluis Mire, MD as Consulting Physician (Obstetrics and Gynecology) Patient  Instructions  Will notify you  of labs when available. 150 minutes of exercise weeks  , weight  To healthy levels.less than 30 bmi Avoid trans fats and processed foods;  Increase fresh fruits and veges to 5 servings per day. And avoid sweet beverages  Including tea and juice.  Consider15- 20 compression stockings or socks if legs are down  And stnding lonmgperiods .  Yearly or every other year check  ( if seeing gyne anyway)   colonocopy at 50 .  Preventive Care for Adults, Female A healthy lifestyle and preventive care can promote health and wellness. Preventive health guidelines  for women include the following key practices.  A routine yearly physical is a good way to check with your caregiver about your health and preventive screening. It is a chance to share any concerns and updates on your health, and to receive a thorough exam.  Visit your dentist for a routine exam and preventive care every 6 months. Brush your teeth twice a day and floss once a day. Good oral hygiene prevents tooth decay and gum disease.  The frequency of eye exams is based on your age, health, family medical history, use of contact lenses, and other factors. Follow your caregiver's recommendations for frequency of eye exams.  Eat a healthy diet. Foods like vegetables, fruits, whole grains, low-fat dairy products, and lean protein foods contain the nutrients you need without too many calories. Decrease your intake of foods high in solid fats, added sugars, and salt. Eat the right amount of calories for you.Get information about a proper diet from your caregiver, if necessary.  Regular physical exercise is one of the most important things you can do for your health. Most adults should get at least 150 minutes of moderate-intensity exercise (any activity that increases your heart rate and causes you to sweat) each week. In addition, most adults need muscle-strengthening exercises on 2 or more days a week.  Maintain a  healthy weight. The body mass index (BMI) is a screening tool to identify possible weight problems. It provides an estimate of body fat based on height and weight. Your caregiver can help determine your BMI, and can help you achieve or maintain a healthy weight.For adults 20 years and older:  A BMI below 18.5 is considered underweight.  A BMI of 18.5 to 24.9 is normal.  A BMI of 25 to 29.9 is considered overweight.  A BMI of 30 and above is considered obese.  Maintain normal blood lipids and cholesterol levels by exercising and minimizing your intake of saturated fat. Eat a balanced diet with plenty of fruit and vegetables. Blood tests for lipids and cholesterol should begin at age 44 and be repeated every 5 years. If your lipid or cholesterol levels are high, you are over 50, or you are at high risk for heart disease, you may need your cholesterol levels checked more frequently.Ongoing high lipid and cholesterol levels should be treated with medicines if diet and exercise are not effective.  If you smoke, find out from your caregiver how to quit. If you do not use tobacco, do not start.  If you are pregnant, do not drink alcohol. If you are breastfeeding, be very cautious about drinking alcohol. If you are not pregnant and choose to drink alcohol, do not exceed 1 drink per day. One drink is considered to be 12 ounces (355 mL) of beer, 5 ounces (148 mL) of wine, or 1.5 ounces (44 mL) of liquor.  Avoid use of street drugs. Do not share needles with anyone. Ask for help if you need support or instructions about stopping the use of drugs.  High blood pressure causes heart disease and increases the risk of stroke. Your blood pressure should be checked at least every 1 to 2 years. Ongoing high blood pressure should be treated with medicines if weight loss and exercise are not effective.  If you are 47 to 42 years old, ask your caregiver if you should take aspirin to prevent strokes.  Diabetes  screening involves taking a blood sample to check your fasting blood sugar level. This should be done once  every 3 years, after age 70, if you are within normal weight and without risk factors for diabetes. Testing should be considered at a younger age or be carried out more frequently if you are overweight and have at least 1 risk factor for diabetes.  Breast cancer screening is essential preventive care for women. You should practice "breast self-awareness." This means understanding the normal appearance and feel of your breasts and may include breast self-examination. Any changes detected, no matter how small, should be reported to a caregiver. Women in their 76s and 30s should have a clinical breast exam (CBE) by a caregiver as part of a regular health exam every 1 to 3 years. After age 69, women should have a CBE every year. Starting at age 76, women should consider having a mammography (breast X-ray test) every year. Women who have a family history of breast cancer should talk to their caregiver about genetic screening. Women at a high risk of breast cancer should talk to their caregivers about having magnetic resonance imaging (MRI) and a mammography every year.  The Pap test is a screening test for cervical cancer. A Pap test can show cell changes on the cervix that might become cervical cancer if left untreated. A Pap test is a procedure in which cells are obtained and examined from the lower end of the uterus (cervix).  Women should have a Pap test starting at age 96.  Between ages 56 and 18, Pap tests should be repeated every 2 years.  Beginning at age 18, you should have a Pap test every 3 years as long as the past 3 Pap tests have been normal.  Some women have medical problems that increase the chance of getting cervical cancer. Talk to your caregiver about these problems. It is especially important to talk to your caregiver if a new problem develops soon after your last Pap test. In these  cases, your caregiver may recommend more frequent screening and Pap tests.  The above recommendations are the same for women who have or have not gotten the vaccine for human papillomavirus (HPV).  If you had a hysterectomy for a problem that was not cancer or a condition that could lead to cancer, then you no longer need Pap tests. Even if you no longer need a Pap test, a regular exam is a good idea to make sure no other problems are starting.  If you are between ages 44 and 20, and you have had normal Pap tests going back 10 years, you no longer need Pap tests. Even if you no longer need a Pap test, a regular exam is a good idea to make sure no other problems are starting.  If you have had past treatment for cervical cancer or a condition that could lead to cancer, you need Pap tests and screening for cancer for at least 20 years after your treatment.  If Pap tests have been discontinued, risk factors (such as a new sexual partner) need to be reassessed to determine if screening should be resumed.  The HPV test is an additional test that may be used for cervical cancer screening. The HPV test looks for the virus that can cause the cell changes on the cervix. The cells collected during the Pap test can be tested for HPV. The HPV test could be used to screen women aged 18 years and older, and should be used in women of any age who have unclear Pap test results. After the age of 41, women  should have HPV testing at the same frequency as a Pap test.  Colorectal cancer can be detected and often prevented. Most routine colorectal cancer screening begins at the age of 73 and continues through age 62. However, your caregiver may recommend screening at an earlier age if you have risk factors for colon cancer. On a yearly basis, your caregiver may provide home test kits to check for hidden blood in the stool. Use of a small camera at the end of a tube, to directly examine the colon (sigmoidoscopy or  colonoscopy), can detect the earliest forms of colorectal cancer. Talk to your caregiver about this at age 55, when routine screening begins. Direct examination of the colon should be repeated every 5 to 10 years through age 6, unless early forms of pre-cancerous polyps or small growths are found.  Hepatitis C blood testing is recommended for all people born from [birth year] through 1965 and any individual with known risks for hepatitis C.  Practice safe sex. Use condoms and avoid high-risk sexual practices to reduce the spread of sexually transmitted infections (STIs). STIs include gonorrhea, chlamydia, syphilis, trichomonas, herpes, HPV, and human immunodeficiency virus (HIV). Herpes, HIV, and HPV are viral illnesses that have no cure. They can result in disability, cancer, and death. Sexually active women aged 8 and younger should be checked for chlamydia. Older women with new or multiple partners should also be tested for chlamydia. Testing for other STIs is recommended if you are sexually active and at increased risk.  Osteoporosis is a disease in which the bones lose minerals and strength with aging. This can result in serious bone fractures. The risk of osteoporosis can be identified using a bone density scan. Women ages 102 and over and women at risk for fractures or osteoporosis should discuss screening with their caregivers. Ask your caregiver whether you should take a calcium supplement or vitamin D to reduce the rate of osteoporosis.  Menopause can be associated with physical symptoms and risks. Hormone replacement therapy is available to decrease symptoms and risks. You should talk to your caregiver about whether hormone replacement therapy is right for you.  Use sunscreen with sun protection factor (SPF) of 30 or more. Apply sunscreen liberally and repeatedly throughout the day. You should seek shade when your shadow is shorter than you. Protect yourself by wearing long sleeves, pants, a  wide-brimmed hat, and sunglasses year round, whenever you are outdoors.  Once a month, do a whole body skin exam, using a mirror to look at the skin on your back. Notify your caregiver of new moles, moles that have irregular borders, moles that are larger than a pencil eraser, or moles that have changed in shape or color.  Stay current with required immunizations.  Influenza. You need a dose every fall (or winter). The composition of the flu vaccine changes each year, so being vaccinated once is not enough.  Pneumococcal polysaccharide. You need 1 to 2 doses if you smoke cigarettes or if you have certain chronic medical conditions. You need 1 dose at age 16 (or older) if you have never been vaccinated.  Tetanus, diphtheria, pertussis (Tdap, Td). Get 1 dose of Tdap vaccine if you are younger than age 3, are over 3 and have contact with an infant, are a Research scientist (physical sciences), are pregnant, or simply want to be protected from whooping cough. After that, you need a Td booster dose every 10 years. Consult your caregiver if you have not had at least 3 tetanus and diphtheria-containing  shots sometime in your life or have a deep or dirty wound.  HPV. You need this vaccine if you are a woman age 70 or younger. The vaccine is given in 3 doses over 6 months.  Measles, mumps, rubella (MMR). You need at least 1 dose of MMR if you were born in 1957 or later. You may also need a second dose.  Meningococcal. If you are age 43 to 76 and a first-year college student living in a residence hall, or have one of several medical conditions, you need to get vaccinated against meningococcal disease. You may also need additional booster doses.  Zoster (shingles). If you are age 27 or older, you should get this vaccine.  Varicella (chickenpox). If you have never had chickenpox or you were vaccinated but received only 1 dose, talk to your caregiver to find out if you need this vaccine.  Hepatitis A. You need this vaccine if  you have a specific risk factor for hepatitis A virus infection or you simply wish to be protected from this disease. The vaccine is usually given as 2 doses, 6 to 18 months apart.  Hepatitis B. You need this vaccine if you have a specific risk factor for hepatitis B virus infection or you simply wish to be protected from this disease. The vaccine is given in 3 doses, usually over 6 months. Preventive Services / Frequency Ages 20 to 95  Blood pressure check.** / Every 1 to 2 years.  Lipid and cholesterol check.** / Every 5 years beginning at age 45.  Clinical breast exam.** / Every 3 years for women in their 73s and 30s.  Pap test.** / Every 2 years from ages 1 through 60. Every 3 years starting at age 83 through age 72 or 45 with a history of 3 consecutive normal Pap tests.  HPV screening.** / Every 3 years from ages 65 through ages 46 to 84 with a history of 3 consecutive normal Pap tests.  Hepatitis C blood test.** / For any individual with known risks for hepatitis C.  Skin self-exam. / Monthly.  Influenza immunization.** / Every year.  Pneumococcal polysaccharide immunization.** / 1 to 2 doses if you smoke cigarettes or if you have certain chronic medical conditions.  Tetanus, diphtheria, pertussis (Tdap, Td) immunization. / A one-time dose of Tdap vaccine. After that, you need a Td booster dose every 10 years.  HPV immunization. / 3 doses over 6 months, if you are 49 and younger.  Measles, mumps, rubella (MMR) immunization. / You need at least 1 dose of MMR if you were born in 1957 or later. You may also need a second dose.  Meningococcal immunization. / 1 dose if you are age 57 to 51 and a first-year college student living in a residence hall, or have one of several medical conditions, you need to get vaccinated against meningococcal disease. You may also need additional booster doses.  Varicella immunization.** / Consult your caregiver.  Hepatitis A immunization.** /  Consult your caregiver. 2 doses, 6 to 18 months apart.  Hepatitis B immunization.** / Consult your caregiver. 3 doses usually over 6 months. Ages 41 to 80  Blood pressure check.** / Every 1 to 2 years.  Lipid and cholesterol check.** / Every 5 years beginning at age 46.  Clinical breast exam.** / Every year after age 20.  Mammogram.** / Every year beginning at age 41 and continuing for as long as you are in good health. Consult with your caregiver.  Pap test.** /  Every 3 years starting at age 62 through age 26 or 63 with a history of 3 consecutive normal Pap tests.  HPV screening.** / Every 3 years from ages 13 through ages 77 to 59 with a history of 3 consecutive normal Pap tests.  Fecal occult blood test (FOBT) of stool. / Every year beginning at age 29 and continuing until age 45. You may not need to do this test if you get a colonoscopy every 10 years.  Flexible sigmoidoscopy or colonoscopy.** / Every 5 years for a flexible sigmoidoscopy or every 10 years for a colonoscopy beginning at age 43 and continuing until age 52.  Hepatitis C blood test.** / For all people born from [birth year] through 1965 and any individual with known risks for hepatitis C.  Skin self-exam. / Monthly.  Influenza immunization.** / Every year.  Pneumococcal polysaccharide immunization.** / 1 to 2 doses if you smoke cigarettes or if you have certain chronic medical conditions.  Tetanus, diphtheria, pertussis (Tdap, Td) immunization.** / A one-time dose of Tdap vaccine. After that, you need a Td booster dose every 10 years.  Measles, mumps, rubella (MMR) immunization. / You need at least 1 dose of MMR if you were born in 1957 or later. You may also need a second dose.  Varicella immunization.** / Consult your caregiver.  Meningococcal immunization.** / Consult your caregiver.  Hepatitis A immunization.** / Consult your caregiver. 2 doses, 6 to 18 months apart.  Hepatitis B immunization.** / Consult your  caregiver. 3 doses, usually over 6 months. Ages 35 and over  Blood pressure check.** / Every 1 to 2 years.  Lipid and cholesterol check.** / Every 5 years beginning at age 35.  Clinical breast exam.** / Every year after age 88.  Mammogram.** / Every year beginning at age 59 and continuing for as long as you are in good health. Consult with your caregiver.  Pap test.** / Every 3 years starting at age 40 through age 39 or 72 with a 3 consecutive normal Pap tests. Testing can be stopped between 65 and 70 with 3 consecutive normal Pap tests and no abnormal Pap or HPV tests in the past 10 years.  HPV screening.** / Every 3 years from ages 50 through ages 7 or 75 with a history of 3 consecutive normal Pap tests. Testing can be stopped between 65 and 70 with 3 consecutive normal Pap tests and no abnormal Pap or HPV tests in the past 10 years.  Fecal occult blood test (FOBT) of stool. / Every year beginning at age 62 and continuing until age 30. You may not need to do this test if you get a colonoscopy every 10 years.  Flexible sigmoidoscopy or colonoscopy.** / Every 5 years for a flexible sigmoidoscopy or every 10 years for a colonoscopy beginning at age 47 and continuing until age 35.  Hepatitis C blood test.** / For all people born from [birth year] through 1965 and any individual with known risks for hepatitis C.  Osteoporosis screening.** / A one-time screening for women ages 38 and over and women at risk for fractures or osteoporosis.  Skin self-exam. / Monthly.  Influenza immunization.** / Every year.  Pneumococcal polysaccharide immunization.** / 1 dose at age 31 (or older) if you have never been vaccinated.  Tetanus, diphtheria, pertussis (Tdap, Td) immunization. / A one-time dose of Tdap vaccine if you are over 65 and have contact with an infant, are a Research scientist (physical sciences), or simply want to be protected from whooping cough.  After that, you need a Td booster dose every 10 years.  Varicella  immunization.** / Consult your caregiver.  Meningococcal immunization.** / Consult your caregiver.  Hepatitis A immunization.** / Consult your caregiver. 2 doses, 6 to 18 months apart.  Hepatitis B immunization.** / Check with your caregiver. 3 doses, usually over 6 months. ** Family history and personal history of risk and conditions may change your caregiver's recommendations. Document Released: 10/07/2001 Document Revised: 11/03/2011 Document Reviewed: 01/06/2011 [facility] Patient Information 2014 [place], [place].     Neta Mends. Panosh M.D.

## 2013-06-15 NOTE — Patient Instructions (Signed)
Will notify you  of labs when available. 150 minutes of exercise weeks  , weight  To healthy levels.less than 30 bmi Avoid trans fats and processed foods;  Increase fresh fruits and veges to 5 servings per day. And avoid sweet beverages  Including tea and juice.  Consider15- 20 compression stockings or socks if legs are down  And stnding lonmgperiods .  Yearly or every other year check  ( if seeing gyne anyway)   colonocopy at 50 .  Preventive Care for Adults, Female A healthy lifestyle and preventive care can promote health and wellness. Preventive health guidelines for women include the following key practices.  A routine yearly physical is a good way to check with your caregiver about your health and preventive screening. It is a chance to share any concerns and updates on your health, and to receive a thorough exam.  Visit your dentist for a routine exam and preventive care every 6 months. Brush your teeth twice a day and floss once a day. Good oral hygiene prevents tooth decay and gum disease.  The frequency of eye exams is based on your age, health, family medical history, use of contact lenses, and other factors. Follow your caregiver's recommendations for frequency of eye exams.  Eat a healthy diet. Foods like vegetables, fruits, whole grains, low-fat dairy products, and lean protein foods contain the nutrients you need without too many calories. Decrease your intake of foods high in solid fats, added sugars, and salt. Eat the right amount of calories for you.Get information about a proper diet from your caregiver, if necessary.  Regular physical exercise is one of the most important things you can do for your health. Most adults should get at least 150 minutes of moderate-intensity exercise (any activity that increases your heart rate and causes you to sweat) each week. In addition, most adults need muscle-strengthening exercises on 2 or more days a week.  Maintain a healthy weight.  The body mass index (BMI) is a screening tool to identify possible weight problems. It provides an estimate of body fat based on height and weight. Your caregiver can help determine your BMI, and can help you achieve or maintain a healthy weight.For adults 20 years and older:  A BMI below 18.5 is considered underweight.  A BMI of 18.5 to 24.9 is normal.  A BMI of 25 to 29.9 is considered overweight.  A BMI of 30 and above is considered obese.  Maintain normal blood lipids and cholesterol levels by exercising and minimizing your intake of saturated fat. Eat a balanced diet with plenty of fruit and vegetables. Blood tests for lipids and cholesterol should begin at age 65 and be repeated every 5 years. If your lipid or cholesterol levels are high, you are over 50, or you are at high risk for heart disease, you may need your cholesterol levels checked more frequently.Ongoing high lipid and cholesterol levels should be treated with medicines if diet and exercise are not effective.  If you smoke, find out from your caregiver how to quit. If you do not use tobacco, do not start.  If you are pregnant, do not drink alcohol. If you are breastfeeding, be very cautious about drinking alcohol. If you are not pregnant and choose to drink alcohol, do not exceed 1 drink per day. One drink is considered to be 12 ounces (355 mL) of beer, 5 ounces (148 mL) of wine, or 1.5 ounces (44 mL) of liquor.  Avoid use of street drugs. Do  not share needles with anyone. Ask for help if you need support or instructions about stopping the use of drugs.  High blood pressure causes heart disease and increases the risk of stroke. Your blood pressure should be checked at least every 1 to 2 years. Ongoing high blood pressure should be treated with medicines if weight loss and exercise are not effective.  If you are 31 to 42 years old, ask your caregiver if you should take aspirin to prevent strokes.  Diabetes screening involves  taking a blood sample to check your fasting blood sugar level. This should be done once every 3 years, after age 59, if you are within normal weight and without risk factors for diabetes. Testing should be considered at a younger age or be carried out more frequently if you are overweight and have at least 1 risk factor for diabetes.  Breast cancer screening is essential preventive care for women. You should practice "breast self-awareness." This means understanding the normal appearance and feel of your breasts and may include breast self-examination. Any changes detected, no matter how small, should be reported to a caregiver. Women in their 28s and 30s should have a clinical breast exam (CBE) by a caregiver as part of a regular health exam every 1 to 3 years. After age 2, women should have a CBE every year. Starting at age 85, women should consider having a mammography (breast X-ray test) every year. Women who have a family history of breast cancer should talk to their caregiver about genetic screening. Women at a high risk of breast cancer should talk to their caregivers about having magnetic resonance imaging (MRI) and a mammography every year.  The Pap test is a screening test for cervical cancer. A Pap test can show cell changes on the cervix that might become cervical cancer if left untreated. A Pap test is a procedure in which cells are obtained and examined from the lower end of the uterus (cervix).  Women should have a Pap test starting at age 72.  Between ages 60 and 94, Pap tests should be repeated every 2 years.  Beginning at age 66, you should have a Pap test every 3 years as long as the past 3 Pap tests have been normal.  Some women have medical problems that increase the chance of getting cervical cancer. Talk to your caregiver about these problems. It is especially important to talk to your caregiver if a new problem develops soon after your last Pap test. In these cases, your  caregiver may recommend more frequent screening and Pap tests.  The above recommendations are the same for women who have or have not gotten the vaccine for human papillomavirus (HPV).  If you had a hysterectomy for a problem that was not cancer or a condition that could lead to cancer, then you no longer need Pap tests. Even if you no longer need a Pap test, a regular exam is a good idea to make sure no other problems are starting.  If you are between ages 29 and 1, and you have had normal Pap tests going back 10 years, you no longer need Pap tests. Even if you no longer need a Pap test, a regular exam is a good idea to make sure no other problems are starting.  If you have had past treatment for cervical cancer or a condition that could lead to cancer, you need Pap tests and screening for cancer for at least 20 years after your treatment.  If Pap tests have been discontinued, risk factors (such as a new sexual partner) need to be reassessed to determine if screening should be resumed.  The HPV test is an additional test that may be used for cervical cancer screening. The HPV test looks for the virus that can cause the cell changes on the cervix. The cells collected during the Pap test can be tested for HPV. The HPV test could be used to screen women aged 57 years and older, and should be used in women of any age who have unclear Pap test results. After the age of 86, women should have HPV testing at the same frequency as a Pap test.  Colorectal cancer can be detected and often prevented. Most routine colorectal cancer screening begins at the age of 21 and continues through age 66. However, your caregiver may recommend screening at an earlier age if you have risk factors for colon cancer. On a yearly basis, your caregiver may provide home test kits to check for hidden blood in the stool. Use of a small camera at the end of a tube, to directly examine the colon (sigmoidoscopy or colonoscopy), can  detect the earliest forms of colorectal cancer. Talk to your caregiver about this at age 52, when routine screening begins. Direct examination of the colon should be repeated every 5 to 10 years through age 58, unless early forms of pre-cancerous polyps or small growths are found.  Hepatitis C blood testing is recommended for all people born from [birth year] through 1965 and any individual with known risks for hepatitis C.  Practice safe sex. Use condoms and avoid high-risk sexual practices to reduce the spread of sexually transmitted infections (STIs). STIs include gonorrhea, chlamydia, syphilis, trichomonas, herpes, HPV, and human immunodeficiency virus (HIV). Herpes, HIV, and HPV are viral illnesses that have no cure. They can result in disability, cancer, and death. Sexually active women aged 56 and younger should be checked for chlamydia. Older women with new or multiple partners should also be tested for chlamydia. Testing for other STIs is recommended if you are sexually active and at increased risk.  Osteoporosis is a disease in which the bones lose minerals and strength with aging. This can result in serious bone fractures. The risk of osteoporosis can be identified using a bone density scan. Women ages 69 and over and women at risk for fractures or osteoporosis should discuss screening with their caregivers. Ask your caregiver whether you should take a calcium supplement or vitamin D to reduce the rate of osteoporosis.  Menopause can be associated with physical symptoms and risks. Hormone replacement therapy is available to decrease symptoms and risks. You should talk to your caregiver about whether hormone replacement therapy is right for you.  Use sunscreen with sun protection factor (SPF) of 30 or more. Apply sunscreen liberally and repeatedly throughout the day. You should seek shade when your shadow is shorter than you. Protect yourself by wearing long sleeves, pants, a wide-brimmed hat, and  sunglasses year round, whenever you are outdoors.  Once a month, do a whole body skin exam, using a mirror to look at the skin on your back. Notify your caregiver of new moles, moles that have irregular borders, moles that are larger than a pencil eraser, or moles that have changed in shape or color.  Stay current with required immunizations.  Influenza. You need a dose every fall (or winter). The composition of the flu vaccine changes each year, so being vaccinated once is not enough.  Pneumococcal polysaccharide. You need 1 to 2 doses if you smoke cigarettes or if you have certain chronic medical conditions. You need 1 dose at age 62 (or older) if you have never been vaccinated.  Tetanus, diphtheria, pertussis (Tdap, Td). Get 1 dose of Tdap vaccine if you are younger than age 65, are over 93 and have contact with an infant, are a Research scientist (physical sciences), are pregnant, or simply want to be protected from whooping cough. After that, you need a Td booster dose every 10 years. Consult your caregiver if you have not had at least 3 tetanus and diphtheria-containing shots sometime in your life or have a deep or dirty wound.  HPV. You need this vaccine if you are a woman age 49 or younger. The vaccine is given in 3 doses over 6 months.  Measles, mumps, rubella (MMR). You need at least 1 dose of MMR if you were born in 1957 or later. You may also need a second dose.  Meningococcal. If you are age 66 to 66 and a first-year college student living in a residence hall, or have one of several medical conditions, you need to get vaccinated against meningococcal disease. You may also need additional booster doses.  Zoster (shingles). If you are age 64 or older, you should get this vaccine.  Varicella (chickenpox). If you have never had chickenpox or you were vaccinated but received only 1 dose, talk to your caregiver to find out if you need this vaccine.  Hepatitis A. You need this vaccine if you have a specific  risk factor for hepatitis A virus infection or you simply wish to be protected from this disease. The vaccine is usually given as 2 doses, 6 to 18 months apart.  Hepatitis B. You need this vaccine if you have a specific risk factor for hepatitis B virus infection or you simply wish to be protected from this disease. The vaccine is given in 3 doses, usually over 6 months. Preventive Services / Frequency Ages 19 to 74  Blood pressure check.** / Every 1 to 2 years.  Lipid and cholesterol check.** / Every 5 years beginning at age 45.  Clinical breast exam.** / Every 3 years for women in their 45s and 30s.  Pap test.** / Every 2 years from ages 54 through 78. Every 3 years starting at age 10 through age 9 or 19 with a history of 3 consecutive normal Pap tests.  HPV screening.** / Every 3 years from ages 4 through ages 58 to 62 with a history of 3 consecutive normal Pap tests.  Hepatitis C blood test.** / For any individual with known risks for hepatitis C.  Skin self-exam. / Monthly.  Influenza immunization.** / Every year.  Pneumococcal polysaccharide immunization.** / 1 to 2 doses if you smoke cigarettes or if you have certain chronic medical conditions.  Tetanus, diphtheria, pertussis (Tdap, Td) immunization. / A one-time dose of Tdap vaccine. After that, you need a Td booster dose every 10 years.  HPV immunization. / 3 doses over 6 months, if you are 28 and younger.  Measles, mumps, rubella (MMR) immunization. / You need at least 1 dose of MMR if you were born in 1957 or later. You may also need a second dose.  Meningococcal immunization. / 1 dose if you are age 1 to 44 and a first-year college student living in a residence hall, or have one of several medical conditions, you need to get vaccinated against meningococcal disease. You may also need additional  booster doses.  Varicella immunization.** / Consult your caregiver.  Hepatitis A immunization.** / Consult your caregiver. 2  doses, 6 to 18 months apart.  Hepatitis B immunization.** / Consult your caregiver. 3 doses usually over 6 months. Ages 21 to 22  Blood pressure check.** / Every 1 to 2 years.  Lipid and cholesterol check.** / Every 5 years beginning at age 40.  Clinical breast exam.** / Every year after age 24.  Mammogram.** / Every year beginning at age 17 and continuing for as long as you are in good health. Consult with your caregiver.  Pap test.** / Every 3 years starting at age 37 through age 23 or 43 with a history of 3 consecutive normal Pap tests.  HPV screening.** / Every 3 years from ages 38 through ages 71 to 3 with a history of 3 consecutive normal Pap tests.  Fecal occult blood test (FOBT) of stool. / Every year beginning at age 81 and continuing until age 10. You may not need to do this test if you get a colonoscopy every 10 years.  Flexible sigmoidoscopy or colonoscopy.** / Every 5 years for a flexible sigmoidoscopy or every 10 years for a colonoscopy beginning at age 56 and continuing until age 11.  Hepatitis C blood test.** / For all people born from [birth year] through 1965 and any individual with known risks for hepatitis C.  Skin self-exam. / Monthly.  Influenza immunization.** / Every year.  Pneumococcal polysaccharide immunization.** / 1 to 2 doses if you smoke cigarettes or if you have certain chronic medical conditions.  Tetanus, diphtheria, pertussis (Tdap, Td) immunization.** / A one-time dose of Tdap vaccine. After that, you need a Td booster dose every 10 years.  Measles, mumps, rubella (MMR) immunization. / You need at least 1 dose of MMR if you were born in 1957 or later. You may also need a second dose.  Varicella immunization.** / Consult your caregiver.  Meningococcal immunization.** / Consult your caregiver.  Hepatitis A immunization.** / Consult your caregiver. 2 doses, 6 to 18 months apart.  Hepatitis B immunization.** / Consult your caregiver. 3 doses, usually  over 6 months. Ages 54 and over  Blood pressure check.** / Every 1 to 2 years.  Lipid and cholesterol check.** / Every 5 years beginning at age 54.  Clinical breast exam.** / Every year after age 78.  Mammogram.** / Every year beginning at age 54 and continuing for as long as you are in good health. Consult with your caregiver.  Pap test.** / Every 3 years starting at age 51 through age 50 or 54 with a 3 consecutive normal Pap tests. Testing can be stopped between 65 and 70 with 3 consecutive normal Pap tests and no abnormal Pap or HPV tests in the past 10 years.  HPV screening.** / Every 3 years from ages 60 through ages 32 or 68 with a history of 3 consecutive normal Pap tests. Testing can be stopped between 65 and 70 with 3 consecutive normal Pap tests and no abnormal Pap or HPV tests in the past 10 years.  Fecal occult blood test (FOBT) of stool. / Every year beginning at age 54 and continuing until age 48. You may not need to do this test if you get a colonoscopy every 10 years.  Flexible sigmoidoscopy or colonoscopy.** / Every 5 years for a flexible sigmoidoscopy or every 10 years for a colonoscopy beginning at age 13 and continuing until age 30.  Hepatitis C blood test.** / For  all people born from [birth year] through 1965 and any individual with known risks for hepatitis C.  Osteoporosis screening.** / A one-time screening for women ages 34 and over and women at risk for fractures or osteoporosis.  Skin self-exam. / Monthly.  Influenza immunization.** / Every year.  Pneumococcal polysaccharide immunization.** / 1 dose at age 15 (or older) if you have never been vaccinated.  Tetanus, diphtheria, pertussis (Tdap, Td) immunization. / A one-time dose of Tdap vaccine if you are over 65 and have contact with an infant, are a Research scientist (physical sciences), or simply want to be protected from whooping cough. After that, you need a Td booster dose every 10 years.  Varicella immunization.** / Consult your  caregiver.  Meningococcal immunization.** / Consult your caregiver.  Hepatitis A immunization.** / Consult your caregiver. 2 doses, 6 to 18 months apart.  Hepatitis B immunization.** / Check with your caregiver. 3 doses, usually over 6 months. ** Family history and personal history of risk and conditions may change your caregiver's recommendations. Document Released: 10/07/2001 Document Revised: 11/03/2011 Document Reviewed: 01/06/2011 [facility] Patient Information 2014 [place], [place].

## 2013-06-22 ENCOUNTER — Other Ambulatory Visit: Payer: Self-pay | Admitting: Family Medicine

## 2013-06-22 DIAGNOSIS — D649 Anemia, unspecified: Secondary | ICD-10-CM

## 2013-06-22 MED ORDER — IRON 325 (65 FE) MG PO TABS: 1.0000 | ORAL_TABLET | Freq: Two times a day (BID) | ORAL | Status: DC

## 2013-09-22 ENCOUNTER — Other Ambulatory Visit: Payer: BC Managed Care – PPO

## 2013-09-27 ENCOUNTER — Other Ambulatory Visit: Payer: BC Managed Care – PPO

## 2013-09-28 ENCOUNTER — Other Ambulatory Visit: Payer: BC Managed Care – PPO

## 2013-09-29 ENCOUNTER — Other Ambulatory Visit (INDEPENDENT_AMBULATORY_CARE_PROVIDER_SITE_OTHER): Payer: BC Managed Care – PPO

## 2013-09-29 DIAGNOSIS — D649 Anemia, unspecified: Secondary | ICD-10-CM

## 2013-09-29 LAB — IBC PANEL
IRON: 21 ug/dL — AB (ref 42–145)
Saturation Ratios: 4.7 % — ABNORMAL LOW (ref 20.0–50.0)
Transferrin: 316.5 mg/dL (ref 212.0–360.0)

## 2013-09-29 LAB — CBC WITH DIFFERENTIAL/PLATELET
Basophils Absolute: 0 10*3/uL (ref 0.0–0.1)
Basophils Relative: 0.4 % (ref 0.0–3.0)
EOS PCT: 2.6 % (ref 0.0–5.0)
Eosinophils Absolute: 0.1 10*3/uL (ref 0.0–0.7)
HEMATOCRIT: 28.4 % — AB (ref 36.0–46.0)
Hemoglobin: 8.5 g/dL — ABNORMAL LOW (ref 12.0–15.0)
LYMPHS ABS: 2 10*3/uL (ref 0.7–4.0)
Lymphocytes Relative: 44.3 % (ref 12.0–46.0)
MCHC: 30 g/dL (ref 30.0–36.0)
MCV: 80.6 fl (ref 78.0–100.0)
Monocytes Absolute: 0.4 10*3/uL (ref 0.1–1.0)
Monocytes Relative: 8.8 % (ref 3.0–12.0)
Neutro Abs: 2 10*3/uL (ref 1.4–7.7)
Neutrophils Relative %: 43.9 % (ref 43.0–77.0)
PLATELETS: 315 10*3/uL (ref 150.0–400.0)
RBC: 3.53 Mil/uL — ABNORMAL LOW (ref 3.87–5.11)
RDW: 17 % — ABNORMAL HIGH (ref 11.5–14.6)
WBC: 4.4 10*3/uL — AB (ref 4.5–10.5)

## 2013-09-29 LAB — FERRITIN: FERRITIN: 2.9 ng/mL — AB (ref 10.0–291.0)

## 2013-09-30 ENCOUNTER — Other Ambulatory Visit: Payer: Self-pay | Admitting: Family Medicine

## 2013-09-30 ENCOUNTER — Encounter: Payer: Self-pay | Admitting: Internal Medicine

## 2013-09-30 ENCOUNTER — Telehealth: Payer: Self-pay | Admitting: Family Medicine

## 2013-09-30 DIAGNOSIS — D509 Iron deficiency anemia, unspecified: Secondary | ICD-10-CM | POA: Insufficient documentation

## 2013-09-30 MED ORDER — FLUCONAZOLE 150 MG PO TABS: 150.0000 mg | ORAL_TABLET | Freq: Once | ORAL | Status: DC

## 2013-09-30 NOTE — Telephone Encounter (Signed)
I called the pt to report her most recent lab work from Dr. Fabian SharpPanosh.  While on the phone she proceeded to tell me that she has a yeast infection.  Has cottage cheese like discharge, a burning sensation and irritation.  Denied fever or recent antibiotics.  I advised the patient to take OTC Monistat and if that did not work than she should call back.  Pt stated that Monistat has never worked for her in the past and that she would like Diflucan sent to her pharmacy.  Spoke with Dr. Fabian SharpPanosh who authorized 1 tab be sent.  Pt notified and will pick up at the pharmacy.

## 2013-11-14 ENCOUNTER — Other Ambulatory Visit: Payer: BC Managed Care – PPO

## 2013-11-15 ENCOUNTER — Other Ambulatory Visit (INDEPENDENT_AMBULATORY_CARE_PROVIDER_SITE_OTHER): Payer: BC Managed Care – PPO

## 2013-11-15 DIAGNOSIS — D649 Anemia, unspecified: Secondary | ICD-10-CM

## 2013-11-15 LAB — CBC WITH DIFFERENTIAL/PLATELET
Basophils Absolute: 0 10*3/uL (ref 0.0–0.1)
Basophils Relative: 0.7 % (ref 0.0–3.0)
Eosinophils Absolute: 0.1 10*3/uL (ref 0.0–0.7)
Eosinophils Relative: 3.1 % (ref 0.0–5.0)
HCT: 29.8 % — ABNORMAL LOW (ref 36.0–46.0)
Hemoglobin: 9.3 g/dL — ABNORMAL LOW (ref 12.0–15.0)
Lymphocytes Relative: 48.7 % — ABNORMAL HIGH (ref 12.0–46.0)
Lymphs Abs: 2 10*3/uL (ref 0.7–4.0)
MCHC: 31.1 g/dL (ref 30.0–36.0)
MCV: 81.9 fl (ref 78.0–100.0)
MONOS PCT: 5.9 % (ref 3.0–12.0)
Monocytes Absolute: 0.2 10*3/uL (ref 0.1–1.0)
NEUTROS PCT: 41.6 % — AB (ref 43.0–77.0)
Neutro Abs: 1.7 10*3/uL (ref 1.4–7.7)
PLATELETS: 288 10*3/uL (ref 150.0–400.0)
RBC: 3.63 Mil/uL — ABNORMAL LOW (ref 3.87–5.11)
RDW: 20.1 % — ABNORMAL HIGH (ref 11.5–14.6)
WBC: 4.1 10*3/uL — AB (ref 4.5–10.5)

## 2013-11-21 ENCOUNTER — Encounter: Payer: BC Managed Care – PPO | Admitting: Internal Medicine

## 2013-11-21 ENCOUNTER — Encounter: Payer: Self-pay | Admitting: Internal Medicine

## 2013-11-21 DIAGNOSIS — Z0289 Encounter for other administrative examinations: Secondary | ICD-10-CM

## 2013-11-21 NOTE — Progress Notes (Signed)
Document opened and reviewed for OV but NSsame day .  

## 2014-06-26 ENCOUNTER — Encounter: Payer: Self-pay | Admitting: Internal Medicine

## 2014-10-11 ENCOUNTER — Encounter: Payer: Self-pay | Admitting: Family Medicine

## 2014-10-11 ENCOUNTER — Ambulatory Visit (INDEPENDENT_AMBULATORY_CARE_PROVIDER_SITE_OTHER): Payer: BLUE CROSS/BLUE SHIELD | Admitting: Family Medicine

## 2014-10-11 VITALS — BP 103/62 | HR 96 | Temp 98.1°F | Ht 67.25 in | Wt 209.0 lb

## 2014-10-11 DIAGNOSIS — T7840XA Allergy, unspecified, initial encounter: Secondary | ICD-10-CM

## 2014-10-11 DIAGNOSIS — T2125XA Burn of second degree of buttock, initial encounter: Secondary | ICD-10-CM

## 2014-10-11 MED ORDER — SILVER SULFADIAZINE 1 % EX CREA: 1.0000 "application " | TOPICAL_CREAM | Freq: Two times a day (BID) | CUTANEOUS | Status: DC

## 2014-10-11 NOTE — Progress Notes (Signed)
Pre visit review using our clinic review tool, if applicable. No additional management support is needed unless otherwise documented below in the visit note. 
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
## 2014-10-13 ENCOUNTER — Telehealth: Payer: Self-pay | Admitting: Internal Medicine

## 2014-10-13 MED ORDER — TRIAMCINOLONE ACETONIDE 0.1 % EX CREA: 1.0000 "application " | TOPICAL_CREAM | Freq: Three times a day (TID) | CUTANEOUS | Status: DC | PRN

## 2014-10-13 NOTE — Telephone Encounter (Signed)
Pt saw dr Clent Ridgesfry 2/17 for a rash . Pt is allergic to neosporin and he advised her to take benadryl  And zyrtec.  Pt state the rash is spreading and she needs something topical.  [facility] [address]

## 2014-10-13 NOTE — Telephone Encounter (Signed)
Call in triamcinolone 1% cream to apply tid prn, 30 grams with one rf

## 2014-10-13 NOTE — Telephone Encounter (Signed)
I sent script e-scribe and spoke with pt. 

## 2014-11-29 ENCOUNTER — Ambulatory Visit (INDEPENDENT_AMBULATORY_CARE_PROVIDER_SITE_OTHER): Payer: Self-pay | Admitting: Internal Medicine

## 2014-11-29 ENCOUNTER — Encounter: Payer: Self-pay | Admitting: Internal Medicine

## 2014-11-29 VITALS — BP 110/66 | Temp 98.2°F | Ht 67.5 in | Wt 203.5 lb

## 2014-11-29 DIAGNOSIS — S39012A Strain of muscle, fascia and tendon of lower back, initial encounter: Secondary | ICD-10-CM

## 2014-11-29 NOTE — Patient Instructions (Signed)
Ice local  And avoid prolonged sitting  May take 2 weeks to be better  Can try aleve 2   Twice a day for pain or discomfort  For up to 7-10 days and then as needed.  If not improving in the next week or so may consider physical therapy .  Caution with concentration driving etc.    Lumbosacral Strain Lumbosacral strain is a strain of any of the parts that make up your lumbosacral vertebrae. Your lumbosacral vertebrae are the bones that make up the lower third of your backbone. Your lumbosacral vertebrae are held together by muscles and tough, fibrous tissue (ligaments).  CAUSES  A sudden blow to your back can cause lumbosacral strain. Also, anything that causes an excessive stretch of the muscles in the low back can cause this strain. This is typically seen when people exert themselves strenuously, fall, lift heavy objects, bend, or crouch repeatedly. RISK FACTORS  Physically demanding work.  Participation in pushing or pulling sports or sports that require a sudden twist of the back (tennis, golf, baseball).  Weight lifting.  Excessive lower back curvature.  Forward-tilted pelvis.  Weak back or abdominal muscles or both.  Tight hamstrings. SIGNS AND SYMPTOMS  Lumbosacral strain may cause pain in the area of your injury or pain that moves (radiates) down your leg.  DIAGNOSIS Your health care provider can often diagnose lumbosacral strain through a physical exam. In some cases, you may need tests such as X-ray exams.  TREATMENT  Treatment for your lower back injury depends on many factors that your clinician will have to evaluate. However, most treatment will include the use of anti-inflammatory medicines. HOME CARE INSTRUCTIONS   Avoid hard physical activities (tennis, racquetball, waterskiing) if you are not in proper physical condition for it. This may aggravate or create problems.  If you have a back problem, avoid sports requiring sudden body movements. Swimming and walking  are generally safer activities.  Maintain good posture.  Maintain a healthy weight.  For acute conditions, you may put ice on the injured area.  Put ice in a plastic bag.  Place a towel between your skin and the bag.  Leave the ice on for 20 minutes, 2-3 times a day.  When the low back starts healing, stretching and strengthening exercises may be recommended. SEEK MEDICAL CARE IF:  Your back pain is getting worse.  You experience severe back pain not relieved with medicines. SEEK IMMEDIATE MEDICAL CARE IF:   You have numbness, tingling, weakness, or problems with the use of your arms or legs.  There is a change in bowel or bladder control.  You have increasing pain in any area of the body, including your belly (abdomen).  You notice shortness of breath, dizziness, or feel faint.  You feel sick to your stomach (nauseous), are throwing up (vomiting), or become sweaty.  You notice discoloration of your toes or legs, or your feet get very cold. MAKE SURE YOU:   Understand these instructions.  Will watch your condition.  Will get help right away if you are not doing well or get worse. Document Released: 05/21/2005 Document Revised: 08/16/2013 Document Reviewed: 03/30/2013 [facility] Patient Information 2015 [place], [place]. This information is not intended to replace advice given to you by your health care provider. Make sure you discuss any questions you have with your health care provider.
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
## 2014-12-22 ENCOUNTER — Encounter: Payer: Self-pay | Admitting: Family Medicine

## 2014-12-22 ENCOUNTER — Ambulatory Visit (INDEPENDENT_AMBULATORY_CARE_PROVIDER_SITE_OTHER): Payer: BLUE CROSS/BLUE SHIELD | Admitting: Family Medicine

## 2014-12-22 VITALS — BP 102/73 | HR 78 | Temp 98.8°F | Ht 67.5 in | Wt 206.0 lb

## 2014-12-22 DIAGNOSIS — S8001XA Contusion of right knee, initial encounter: Secondary | ICD-10-CM
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
## 2014-12-22 NOTE — Progress Notes (Signed)
Pre visit review using our clinic review tool, if applicable. No additional management support is needed unless otherwise documented below in the visit note. 

## 2015-02-12 ENCOUNTER — Telehealth: Payer: Self-pay | Admitting: Internal Medicine

## 2015-02-12 MED ORDER — TRIAMCINOLONE ACETONIDE 0.1 % EX CREA: 1.0000 "application " | TOPICAL_CREAM | Freq: Three times a day (TID) | CUTANEOUS | Status: DC | PRN

## 2015-02-12 NOTE — Telephone Encounter (Signed)
Ok to refill the [facility] x 1 add instructions not  For use on  The face

## 2015-02-12 NOTE — Telephone Encounter (Signed)
April Levine, Please call the patient and triage her l or transfer to [facility]. I will advise [name] but it appears she is just wanting a refill on a med we Rx'd for her in February.

## 2015-02-12 NOTE — Telephone Encounter (Signed)
Sent to the pharmacy by e-scribe.  Pt notified by telephone (norma).

## 2015-02-12 NOTE — Telephone Encounter (Signed)
[facility] - Client TELEPHONE ADVICE RECORD [facility]  Patient Name: April Levine  DOB: 1970/09/03    Initial Comment caller states she needs an rx refill   Nurse Assessment  Nurse: Laural Benes, RN, Dondra Spry Date/Time Lamount Cohen Time): 02/12/2015 9:55:59 AM  Confirm and document reason for call. If symptomatic, describe symptoms. ---Ruth is need of a new RX for triamcinolone cream for allergic reaction to neosporin has rash to neck and the cream has helped but is now out. [facility] this is on file send it there. if not going to call in RX please call Tanaiyah back  Has the patient traveled out of the country within the last 30 days? ---No  Does the patient require triage? ---No  Please document clinical information provided and list any resource used. ---Needs TRIAMCINOLONE CREAM called in to pharmacy on file. thanks.     Guidelines    Guideline Title Affirmed Question Affirmed Notes       Final Disposition User        Comments  Call reviewed the correct number is [phone]. Nurse notified.

## 2015-02-12 NOTE — Telephone Encounter (Signed)
Spoke to the pt.  Rash is on her neck.  Rash does itch.  Pt states it looks similar to hives.  Rash started last Wednesday.  No fever or sickness.  Recently back from [place], [place].  Okay to refill?

## 2015-02-12 NOTE — Telephone Encounter (Signed)
Message was not triaged.  Where is the rash?  Fever?  How long has it been there?  What does it look like?  Any oozing or weeping?

## 2015-05-01 ENCOUNTER — Encounter: Payer: Self-pay | Admitting: Family

## 2015-05-01 ENCOUNTER — Ambulatory Visit (INDEPENDENT_AMBULATORY_CARE_PROVIDER_SITE_OTHER): Payer: BLUE CROSS/BLUE SHIELD | Admitting: Family

## 2015-05-01 VITALS — BP 114/70 | HR 81 | Temp 99.4°F | Resp 18 | Ht 68.5 in | Wt 203.0 lb

## 2015-05-01 DIAGNOSIS — J069 Acute upper respiratory infection, unspecified: Secondary | ICD-10-CM

## 2015-05-01 DIAGNOSIS — J209 Acute bronchitis, unspecified: Secondary | ICD-10-CM

## 2015-05-01 MED ORDER — PREDNISONE 20 MG PO TABS
40.0000 mg | ORAL_TABLET | Freq: Every day | ORAL | Status: DC
Start: 2015-05-01 — End: 2015-05-09

## 2015-05-01 NOTE — Progress Notes (Signed)
Subjective:    Patient ID: April Levine, female    DOB: 1971-04-14, 44 y.o.   MRN: [id number]  HPI 44 year old female, patient of Dr. Fabian Sharp, is in today with c/o cough, congestion x 3 days. Has been taking Nyquil and Dayquil without much help. No sick contacts    Review of Systems  Constitutional: Negative.   HENT: Positive for congestion and postnasal drip.   Respiratory: Positive for cough. Negative for wheezing.   Cardiovascular: Negative.   Endocrine: Negative.   Genitourinary: Negative.   Musculoskeletal: Negative.   Skin: Negative.   Allergic/Immunologic: Negative.   Psychiatric/Behavioral: Negative.    Past Medical History  Diagnosis Date  . Anemia   . Anemia   . Chicken pox   . MRSA (methicillin resistant staph aureus) culture positive   . DVT (deep vein thrombosis) in pregnancy 2003     and post surgery left leg  knee in preg    Social History   Social History  . Marital Status: Single    Spouse Name: N/A  . Number of Children: N/A  . Years of Education: N/A   Occupational History  . Not on file.   Social History Main Topics  . Smoking status: Never Smoker   . Smokeless tobacco: Never Used  . Alcohol Use: 0.0 oz/week    0 Standard drinks or equivalent per week     Comment: occ  . Drug Use: No  . Sexual Activity: Yes    Birth Control/ Protection: IUD     Comment: paraguard   Other Topics Concern  . Not on file   Social History Narrative   Regular exercise-No   7-8 hours of sleep per night   3 people in the home  18 and 11    Has 1 dog.   Poly sci  Degree and and MBA    From [employer]ologist  9-5    On call ups tracking    caffiene ocass    etoh    6 preg 2 Live births             Past Surgical History  Procedure Laterality Date  . Knee arthroscopy      had dvt post op    Family History  Problem Relation Age of Onset  . Alcohol abuse Other   . Arthritis Mother   . Hypertension Mother   . Dementia Father   .  Alcohol abuse Maternal Grandmother   . Pulmonary embolism Mother   . Stroke Father     Allergies  Allergen Reactions  . Neosporin Original [Bacitracin-Neomycin-Polymyxin] Rash    Current Outpatient Prescriptions on File Prior to Visit  Medication Sig Dispense Refill  . Ferrous Sulfate (IRON) 325 (65 FE) MG TABS Take 1 tablet by mouth 2 (two) times daily. 60 each 2   No current facility-administered medications on file prior to visit.    BP 114/70 mmHg  Pulse 81  Temp(Src) 99.4 F (37.4 C) (Oral)  Resp 18  Ht 5' 8.5" (1.74 m)  Wt 203 lb (92.08 kg)  BMI 30.41 kg/m2  SpO2 97%chart     Objective:   Physical Exam  Constitutional: She is oriented to person, place, and time. She appears well-developed and well-nourished.  HENT:  Right Ear: External ear normal.  Left Ear: External ear normal.  Nose: Nose normal.  Nasal turbinates boggy and red   Neck: Normal range of motion. Neck supple.  Cardiovascular: Normal  rate, regular rhythm and normal heart sounds.   Pulmonary/Chest: Effort normal.  Course breath sounds   Musculoskeletal: Normal range of motion.  Neurological: She is alert and oriented to person, place, and time.  Skin: Skin is warm and dry.  Psychiatric: She has a normal mood and affect.          Assessment & Plan:  April Levine was seen today for acute visit.  Diagnoses and all orders for this visit:  Acute upper respiratory infection  Acute bronchitis, unspecified organism  Other orders -     predniSONE (DELTASONE) 20 MG tablet; Take 2 tablets (40 mg total) by mouth daily with breakfast.   Call the office with any questions or concerns. Recheck as scheduled and as needed

## 2015-05-01 NOTE — Progress Notes (Signed)
Pre visit review using our clinic review tool, if applicable. No additional management support is needed unless otherwise documented below in the visit note. 

## 2015-05-01 NOTE — Patient Instructions (Signed)
Upper Respiratory Infection, Adult An upper respiratory infection (URI) is also sometimes known as the common cold. The upper respiratory tract includes the nose, sinuses, throat, trachea, and bronchi. Bronchi are the airways leading to the lungs. Most people improve within 1 week, but symptoms can last up to 2 weeks. A residual cough may last even longer.  CAUSES Many different viruses can infect the tissues lining the upper respiratory tract. The tissues become irritated and inflamed and often become very moist. Mucus production is also common. A cold is contagious. You can easily spread the virus to others by oral contact. This includes kissing, sharing a glass, coughing, or sneezing. Touching your mouth or nose and then touching a surface, which is then touched by another person, can also spread the virus. SYMPTOMS  Symptoms typically develop 1 to 3 days after you come in contact with a cold virus. Symptoms vary from person to person. They may include:  Runny nose.  Sneezing.  Nasal congestion.  Sinus irritation.  Sore throat.  Loss of voice (laryngitis).  Cough.  Fatigue.  Muscle aches.  Loss of appetite.  Headache.  Low-grade fever. DIAGNOSIS  You might diagnose your own cold based on familiar symptoms, since most people get a cold 2 to 3 times a year. Your caregiver can confirm this based on your exam. Most importantly, your caregiver can check that your symptoms are not due to another disease such as strep throat, sinusitis, pneumonia, asthma, or epiglottitis. Blood tests, throat tests, and X-rays are not necessary to diagnose a common cold, but they may sometimes be helpful in excluding other more serious diseases. Your caregiver will decide if any further tests are required. RISKS AND COMPLICATIONS  You may be at risk for a more severe case of the common cold if you smoke cigarettes, have chronic heart disease (such as heart failure) or lung disease (such as asthma), or if  you have a weakened immune system. The very young and very old are also at risk for more serious infections. Bacterial sinusitis, middle ear infections, and bacterial pneumonia can complicate the common cold. The common cold can worsen asthma and chronic obstructive pulmonary disease (COPD). Sometimes, these complications can require emergency medical care and may be life-threatening. PREVENTION  The best way to protect against getting a cold is to practice good hygiene. Avoid oral or hand contact with people with cold symptoms. Wash your hands often if contact occurs. There is no clear evidence that vitamin C, vitamin E, echinacea, or exercise reduces the chance of developing a cold. However, it is always recommended to get plenty of rest and practice good nutrition. TREATMENT  Treatment is directed at relieving symptoms. There is no cure. Antibiotics are not effective, because the infection is caused by a virus, not by bacteria. Treatment may include:  Increased fluid intake. Sports drinks offer valuable electrolytes, sugars, and fluids.  Breathing heated mist or steam (vaporizer or shower).  Eating chicken soup or other clear broths, and maintaining good nutrition.  Getting plenty of rest.  Using gargles or lozenges for comfort.  Controlling fevers with ibuprofen or acetaminophen as directed by your caregiver.  Increasing usage of your inhaler if you have asthma. Zinc gel and zinc lozenges, taken in the first 24 hours of the common cold, can shorten the duration and lessen the severity of symptoms. Pain medicines may help with fever, muscle aches, and throat pain. A variety of non-prescription medicines are available to treat congestion and runny nose. Your caregiver   can make recommendations and may suggest nasal or lung inhalers for other symptoms.  HOME CARE INSTRUCTIONS   Only take over-the-counter or prescription medicines for pain, discomfort, or fever as directed by your  caregiver.  Use a warm mist humidifier or inhale steam from a shower to increase air moisture. This may keep secretions moist and make it easier to breathe.  Drink enough water and fluids to keep your urine clear or pale yellow.  Rest as needed.  Return to work when your temperature has returned to normal or as your caregiver advises. You may need to stay home longer to avoid infecting others. You can also use a face mask and careful hand washing to prevent spread of the virus. SEEK MEDICAL CARE IF:   After the first few days, you feel you are getting worse rather than better.  You need your caregiver's advice about medicines to control symptoms.  You develop chills, worsening shortness of breath, or brown or red sputum. These may be signs of pneumonia.  You develop yellow or brown nasal discharge or pain in the face, especially when you bend forward. These may be signs of sinusitis.  You develop a fever, swollen neck glands, pain with swallowing, or white areas in the back of your throat. These may be signs of strep throat. SEEK IMMEDIATE MEDICAL CARE IF:   You have a fever.  You develop severe or persistent headache, ear pain, sinus pain, or chest pain.  You develop wheezing, a prolonged cough, cough up blood, or have a change in your usual mucus (if you have chronic lung disease).  You develop sore muscles or a stiff neck. Document Released: 02/04/2001 Document Revised: 11/03/2011 Document Reviewed: 11/16/2013 [employer] Patient Information 2015 [employer]. This information is not intended to replace advice given to you by your health care provider. Make sure you discuss any questions you have with your health care provider.  

## 2015-05-09 ENCOUNTER — Ambulatory Visit (INDEPENDENT_AMBULATORY_CARE_PROVIDER_SITE_OTHER): Payer: BLUE CROSS/BLUE SHIELD | Admitting: Family Medicine

## 2015-05-09 ENCOUNTER — Encounter: Payer: Self-pay | Admitting: Family Medicine

## 2015-05-09 VITALS — BP 113/79 | HR 79 | Temp 98.6°F | Ht 68.5 in | Wt 203.0 lb

## 2015-05-09 DIAGNOSIS — H5711 Ocular pain, right eye: Secondary | ICD-10-CM

## 2015-05-09 NOTE — Progress Notes (Signed)
   Subjective:    Patient ID: April Levine, female    DOB: [birth year], 44 y.o.   MRN: [id number]  HPI Here for the sudden onset last night of redness, pain, blurred vision, and light sensitivity in the right eye. No crusting or matting of the lids. No recent trauma. The left eye is fine. She was recently treated for a viral URI with 6 days of prednisone at 40 mg a day. This was ended 3 days ago. She does not wear contacts.    Review of Systems  Constitutional: Negative.   HENT: Negative.   Eyes: Positive for photophobia, pain, redness and visual disturbance. Negative for discharge and itching.  Respiratory: Negative.   Cardiovascular: Negative.   Neurological: Negative.        Objective:   Physical Exam  Constitutional: She is oriented to person, place, and time. She appears well-developed and well-nourished.  HENT:  Right Ear: External ear normal.  Left Ear: External ear normal.  Nose: Nose normal.  Mouth/Throat: Oropharynx is clear and moist.  Eyes: EOM are normal. Right eye exhibits no discharge. Left eye exhibits no discharge.  The left eye is clear. The right eye shows the entire conjunctiva to be red, she is photophobic. The cornea is clear.   Neck: Neck supple. No thyromegaly present.  Lymphadenopathy:    She has no cervical adenopathy.  Neurological: She is alert and oriented to person, place, and time. No cranial nerve deficit.          Assessment & Plan:  Acute eye syndrome which needs to be seen by an Ophthalmologist today. Possible etiologies include glaucoma or iritis. We will set up a referral.

## 2015-05-09 NOTE — Progress Notes (Signed)
Pre visit review using our clinic review tool, if applicable. No additional management support is needed unless otherwise documented below in the visit note. 

## 2015-05-23 ENCOUNTER — Encounter: Payer: BLUE CROSS/BLUE SHIELD | Admitting: Internal Medicine

## 2015-05-23 NOTE — Progress Notes (Signed)
Document opened and reviewed for OV but appt  NS same day .   

## 2015-12-19 DIAGNOSIS — D2239 Melanocytic nevi of other parts of face: Secondary | ICD-10-CM | POA: Diagnosis not present

## 2015-12-19 DIAGNOSIS — D485 Neoplasm of uncertain behavior of skin: Secondary | ICD-10-CM | POA: Diagnosis not present

## 2016-01-15 DIAGNOSIS — Z6832 Body mass index (BMI) 32.0-32.9, adult: Secondary | ICD-10-CM | POA: Diagnosis not present

## 2016-01-15 DIAGNOSIS — Z1321 Encounter for screening for nutritional disorder: Secondary | ICD-10-CM | POA: Diagnosis not present

## 2016-01-15 DIAGNOSIS — Z01419 Encounter for gynecological examination (general) (routine) without abnormal findings: Secondary | ICD-10-CM | POA: Diagnosis not present

## 2016-02-28 DIAGNOSIS — Z1231 Encounter for screening mammogram for malignant neoplasm of breast: Secondary | ICD-10-CM | POA: Diagnosis not present

## 2016-02-28 DIAGNOSIS — K900 Celiac disease: Secondary | ICD-10-CM | POA: Diagnosis not present

## 2016-02-28 DIAGNOSIS — Z1321 Encounter for screening for nutritional disorder: Secondary | ICD-10-CM | POA: Diagnosis not present

## 2016-04-14 ENCOUNTER — Ambulatory Visit (INDEPENDENT_AMBULATORY_CARE_PROVIDER_SITE_OTHER): Payer: BLUE CROSS/BLUE SHIELD | Admitting: Internal Medicine

## 2016-04-14 ENCOUNTER — Encounter: Payer: Self-pay | Admitting: Internal Medicine

## 2016-04-14 VITALS — BP 110/60 | Temp 99.2°F | Wt 207.0 lb

## 2016-04-14 DIAGNOSIS — M792 Neuralgia and neuritis, unspecified: Secondary | ICD-10-CM

## 2016-04-14 DIAGNOSIS — M79672 Pain in left foot: Secondary | ICD-10-CM

## 2016-04-14 NOTE — Progress Notes (Signed)
Chief Complaint  Patient presents with  . Foot Pain    Left foot.  Pt described sharp pains in her toes that she thinks is nerve pains.  Pt states she has plantar fasciitis in the same foot.    HPI: April Levine 45 y.o. comes in for SDA appointment for problem over the last month it is new and becoming more frequent. She has a history of Planter fasciitis in the left foot for about a year and  a podiatrist gave her a cortisone shot which helped her somewhat. And still has some symptoms. But without injury she had the onset of what she calls  Sharp dul pain shooting pain  Into middle toes that  last  Second s  Not nocturnal  But driving  And sitting .  Can bother her out of the blu  Saw commercial on tv and wonders if has  Neuropathy dm etc.      ROS: See pertinent positives and negatives per HPI. Hx of dvt left leg   Swelling in both legs not new .   Has hx of anemia  Has gyne   Past Medical History:  Diagnosis Date  . Anemia   . Anemia   . Chicken pox   . DVT (deep vein thrombosis) in pregnancy 2003    and post surgery left leg  knee in preg  . MRSA (methicillin resistant staph aureus) culture positive     Family History  Problem Relation Age of Onset  . Alcohol abuse Other   . Arthritis Mother   . Hypertension Mother   . Dementia Father   . Alcohol abuse Maternal Grandmother   . Pulmonary embolism Mother   . Stroke Father     Social History   Social History  . Marital status: Single    Spouse name: N/A  . Number of children: N/A  . Years of education: N/A   Social History Main Topics  . Smoking status: Never Smoker  . Smokeless tobacco: Never Used  . Alcohol use 0.0 oz/week     Comment: occ  . Drug use: No  . Sexual activity: Yes    Birth control/ protection: IUD     Comment: paraguard   Other Topics Concern  . None   Social History Narrative   Regular exercise-No   7-8 hours of sleep per night   3 people in the home  18 and 11    Has 1 dog.   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 Ferrous Sulfate (IRON) 325 (65 FE) MG TABS Take 1 tablet by mouth 2 (two) times daily. (Patient not taking: Reported on 05/09/2015) 60 each 2   No facility-administered medications prior to visit.      EXAM:  BP 110/60 (BP Location: Right Arm, Patient Position: Sitting, Cuff Size: Large)   Temp 99.2 F (37.3 C) (Oral)   Wt 207 lb (93.9 kg)   BMI 31.02 kg/m   Body mass index is 31.02 kg/m.  GENERAL: vitals reviewed and listed above, alert, oriented, appears well hydrated and in no acute distress HEENT: atraumatic, conjunctiva  clear, no obvious abnormalities on  inspection of external nose and ears  MS: moves all extremities without noticeable focal  Abnormality  1+ swelling  l in sandals  Left goot no acute deformity  Pulse intact   No bony tenderness or metatarsal head tenderness and ? Neg squeeze   No ulcers or skin changes PSYCH: pleasant and cooperative, no obvious depression or anxiety   ASSESSMENT AND PLAN:  Discussed the following assessment and plan:  Foot pain, left - Plan: Basic metabolic panel, CBC with Differential/Platelet, Hemoglobin A1c, TSH, T4, free, Vitamin B12, Sedimentation rate, Ambulatory referral to Sports Medicine  Nerve pain - left forefoot to toes  - Plan: Basic metabolic panel, CBC with Differential/Platelet, Hemoglobin A1c, TSH, T4, free, Vitamin B12, Sedimentation rate, Ambulatory referral to Sports Medicine Hx of PF     -Patient advised to return or notify health care team  if symptoms worsen ,persist or new concerns arise.  Patient Instructions   We'll do blood work today to look at metabolic causes of nerve pain. This however may be something like a Morton's neuroma or something related to your foot mechanics your  Planter fasciitis. We'll get sports medicine appointment you can change the time if needed. Suggest soft tissue inserts to support the bottom of the foot. Also do exercises that will help your Planter fasciitis.    Morton Neuralgia Morton neuralgia is a type of foot pain in the area closest to your toes. This area is sometimes called the ball of your foot. Morton neuralgia occurs when a branch of a nerve in your foot (digital nerve) becomes compressed.  When this happens over a long period of time, the nerve can thicken (neuroma) and cause pain. This usually occurs between the third and fourth toe. Morton neuralgia can come and go but may get worse over time.  CAUSES Your digital nerve can become compressed and stretched at a point where it passes under a thick band of tissue that connects your toes (intermetatarsal ligament). Morton neuralgia can be caused by mild repetitive damage in this area. This type of damage can result from:   Activities such as running or jumping.  Wearing shoes that are too tight. RISK FACTORS You may be at risk for Morton neuralgia if you:  Are female.  Wear high heels.  Wear shoes that are narrow or tight.  Participate in activities that stretch your toes. These include:  Running.  Ballet.  Long-distance walking. SIGNS AND SYMPTOMS The first symptom of Morton neuralgia is pain that spreads from the ball of your foot to your toes. It may feel like you are walking on a marble. Pain usually gets worse with walking and goes away at night. Other symptoms may include numbness and cramping of your toes. DIAGNOSIS  Your health care provider will do a physical exam. When doing the exam, your health care provider may:   Squeeze your foot just behind your toe.  Ask you to move your toes to check for pain. You may also have tests on your foot to confirm the diagnosis. These may include:   An X-ray.  An MRI. TREATMENT  Treatment for Morton neuralgia may be  as simple as changing the kind of shoes you wear. Other treatments may include:  Wearing a supportive pad (orthosis) under the front of your foot. This lifts your toe bones and takes pressure off the nerve.  Getting injections of numbing medicine and anti-inflammatory medicine (steroid) in the nerve.  Having surgery to remove part of the thickened nerve.  HOME CARE INSTRUCTIONS   Take medicine only as directed by your health care provider.  Wear soft-soled shoes with a wide toe area.  Stop activities that may be causing pain.  Elevate your foot when resting.  Massage your foot.  Apply ice to the injured area:   Put ice in a plastic bag.  Place a towel between your skin and the bag.  Leave the ice on for 20 minutes, 2-3 times a day.   Keep all follow-up visits as directed by your health care provider. This is important. SEEK MEDICAL CARE IF:  Home care instructions are not helping you get better.  Your symptoms change or get worse.   This information is not intended to replace advice given to you by your health care provider. Make sure you discuss any questions you have with your health care provider.   Document Released: 11/17/2000 Document Revised: 09/01/2014 Document Reviewed: 10/12/2013 Elsevier Interactive Patient Education 2016 [employer]     GenoaWanda K. Panosh M.D.

## 2016-04-14 NOTE — Progress Notes (Signed)
Pre visit review using our clinic review tool, if applicable. No additional management support is needed unless otherwise documented below in the visit note. 

## 2016-04-14 NOTE — Patient Instructions (Addendum)
We'll do blood work today to look at metabolic causes of nerve pain. This however may be something like a Morton's neuroma or something related to your foot mechanics your Planter fasciitis. We'll get sports medicine appointment you can change the time if needed. Suggest soft tissue inserts to support the bottom of the foot. Also do exercises that will help your Planter fasciitis.    Morton Neuralgia Morton neuralgia is a type of foot pain in the area closest to your toes. This area is sometimes called the ball of your foot. Morton neuralgia occurs when a branch of a nerve in your foot (digital nerve) becomes compressed.  When this happens over a long period of time, the nerve can thicken (neuroma) and cause pain. This usually occurs between the third and fourth toe. Morton neuralgia can come and go but may get worse over time.  CAUSES Your digital nerve can become compressed and stretched at a point where it passes under a thick band of tissue that connects your toes (intermetatarsal ligament). Morton neuralgia can be caused by mild repetitive damage in this area. This type of damage can result from:   Activities such as running or jumping.  Wearing shoes that are too tight. RISK FACTORS You may be at risk for Morton neuralgia if you:  Are female.  Wear high heels.  Wear shoes that are narrow or tight.  Participate in activities that stretch your toes. These include:  Running.  Ballet.  Long-distance walking. SIGNS AND SYMPTOMS The first symptom of Morton neuralgia is pain that spreads from the ball of your foot to your toes. It may feel like you are walking on a marble. Pain usually gets worse with walking and goes away at night. Other symptoms may include numbness and cramping of your toes. DIAGNOSIS  Your health care provider will do a physical exam. When doing the exam, your health care provider may:   Squeeze your foot just behind your toe.  Ask you to move your toes to  check for pain. You may also have tests on your foot to confirm the diagnosis. These may include:   An X-ray.  An MRI. TREATMENT  Treatment for Morton neuralgia may be as simple as changing the kind of shoes you wear. Other treatments may include:  Wearing a supportive pad (orthosis) under the front of your foot. This lifts your toe bones and takes pressure off the nerve.  Getting injections of numbing medicine and anti-inflammatory medicine (steroid) in the nerve.  Having surgery to remove part of the thickened nerve. HOME CARE INSTRUCTIONS   Take medicine only as directed by your health care provider.  Wear soft-soled shoes with a wide toe area.  Stop activities that may be causing pain.  Elevate your foot when resting.  Massage your foot.  Apply ice to the injured area:   Put ice in a plastic bag.  Place a towel between your skin and the bag.  Leave the ice on for 20 minutes, 2-3 times a day.   Keep all follow-up visits as directed by your health care provider. This is important. SEEK MEDICAL CARE IF:  Home care instructions are not helping you get better.  Your symptoms change or get worse.   This information is not intended to replace advice given to you by your health care provider. Make sure you discuss any questions you have with your health care provider.   Document Released: 11/17/2000 Document Revised: 09/01/2014 Document Reviewed: 10/12/2013 Elsevier Interactive Patient  Education 2016 [employer].

## 2016-04-15 LAB — CBC WITH DIFFERENTIAL/PLATELET
BASOS PCT: 0.4 % (ref 0.0–3.0)
Basophils Absolute: 0 10*3/uL (ref 0.0–0.1)
EOS PCT: 2.7 % (ref 0.0–5.0)
Eosinophils Absolute: 0.2 10*3/uL (ref 0.0–0.7)
HCT: 32.8 % — ABNORMAL LOW (ref 36.0–46.0)
Hemoglobin: 10.6 g/dL — ABNORMAL LOW (ref 12.0–15.0)
LYMPHS ABS: 1.7 10*3/uL (ref 0.7–4.0)
Lymphocytes Relative: 29.4 % (ref 12.0–46.0)
MCHC: 32.3 g/dL (ref 30.0–36.0)
MCV: 83.4 fl (ref 78.0–100.0)
MONOS PCT: 7 % (ref 3.0–12.0)
Monocytes Absolute: 0.4 10*3/uL (ref 0.1–1.0)
NEUTROS ABS: 3.4 10*3/uL (ref 1.4–7.7)
NEUTROS PCT: 60.5 % (ref 43.0–77.0)
PLATELETS: 279 10*3/uL (ref 150.0–400.0)
RBC: 3.93 Mil/uL (ref 3.87–5.11)
RDW: 16.5 % — AB (ref 11.5–15.5)
WBC: 5.7 10*3/uL (ref 4.0–10.5)

## 2016-04-15 LAB — TSH: TSH: 1.64 u[IU]/mL (ref 0.35–4.50)

## 2016-04-15 LAB — BASIC METABOLIC PANEL
BUN: 11 mg/dL (ref 6–23)
CO2: 25 meq/L (ref 19–32)
Calcium: 9 mg/dL (ref 8.4–10.5)
Chloride: 106 mEq/L (ref 96–112)
Creatinine, Ser: 0.88 mg/dL (ref 0.40–1.20)
GFR: 89.42 mL/min (ref >60.00)
GLUCOSE: 107 mg/dL — AB (ref 70–99)
POTASSIUM: 3.5 meq/L (ref 3.5–5.1)
SODIUM: 139 meq/L (ref 135–145)

## 2016-04-15 LAB — T4, FREE: Free T4: 0.84 ng/dL (ref 0.60–1.60)

## 2016-04-15 LAB — HEMOGLOBIN A1C: Hgb A1c MFr Bld: 5.9 % (ref 4.6–6.5)

## 2016-04-15 LAB — SEDIMENTATION RATE: Sed Rate: 40 mm/hr — ABNORMAL HIGH (ref 0–20)

## 2016-04-15 LAB — VITAMIN B12: VITAMIN B 12: 440 pg/mL (ref 211–911)

## 2016-04-18 ENCOUNTER — Telehealth: Payer: Self-pay

## 2016-04-18 NOTE — Telephone Encounter (Signed)
Called patient. Gave lab results. Patient verbalized understanding.  

## 2016-04-18 NOTE — Telephone Encounter (Signed)
-----   Message from Madelin HeadingsWanda K Panosh, MD sent at 04/18/2016  8:18 AM EDT ----- Blood tests no diabetes  Thyroid is normal   But is still mildly  Anemic .Marland Kitchen.  b12 is normal  Suspect the anemia is  Low iron

## 2016-04-24 ENCOUNTER — Ambulatory Visit: Payer: BLUE CROSS/BLUE SHIELD | Admitting: Sports Medicine

## 2016-10-06 DIAGNOSIS — N390 Urinary tract infection, site not specified: Secondary | ICD-10-CM | POA: Diagnosis not present

## 2016-10-13 NOTE — Progress Notes (Signed)
Pre visit review using our clinic review tool, if applicable. No additional management support is needed unless otherwise documented below in the visit note.  Chief Complaint  Patient presents with  . Pain Under Right Arm    HPI: MARRIETTA Levine 46 y.o.  sda  Acute  problem  Right lateral breast axill area pain soresness   And then ? Worse  1 week hx of pain   Doing handweights and stretching and ? Worse   And  Down to breast   Hard  to raise arm .   No hc of same  Gets pre menstrual breast sx.    rx none. Period due in another week or so .  Concern no dc   Pos caffiene no fever  ROS: See pertinent positives and negatives per HPI. No cp sob    Past Medical History:  Diagnosis Date  . Anemia   . Anemia   . Chicken pox   . DVT (deep vein thrombosis) in pregnancy (HCC) 2003    and post surgery left leg  knee in preg  . MRSA (methicillin resistant staph aureus) culture positive     Family History  Problem Relation Age of Onset  . Alcohol abuse Other   . Arthritis Mother   . Hypertension Mother   . Dementia Father   . Alcohol abuse Maternal Grandmother   . Pulmonary embolism Mother   . Stroke Father     Social History   Social History  . Marital status: Single    Spouse name: N/A  . Number of children: N/A  . Years of education: N/A   Social History Main Topics  . Smoking status: Never Smoker  . Smokeless tobacco: Never Used  . Alcohol use 0.0 oz/week     Comment: occ  . Drug use: No  . Sexual activity: Yes    Birth control/ protection: IUD     Comment: paraguard   Other Topics Concern  . None   Social History Narrative   Regular exercise-No   7-8 hours of sleep per night   3 people in the home  18 and 11    Has 1 dog.   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   No facility-administered  medications prior to visit.      EXAM:  BP 128/90 (BP Location: Right Arm, Patient Position: Sitting, Cuff Size: Normal)   Temp 98.6 F (37 C) (Oral)   Wt 212 lb (96.2 kg)   BMI 31.77 kg/m   Body mass index is 31.77 kg/m.  GENERAL: vitals reviewed and listed above, alert, oriented, appears well hydrated and in no acute distress HEENT: atraumatic, conjunctiva  clear, no obvious abnormalities on inspection of external nose and ears NECK: no obvious masses on inspection palpation  Examination of breasts pendulous no specific nodule there is some fullness or thickening the right upper outer quadrant into the axilla question breast tissue versus muscular. It is tender in this area axilla shows no adenopathy or asymmetry. Skin appears clear. CV: HRRR, no clubbing cyanosis or  peripheral edema nl cap refill  MS: moves all extremities without noticeable focal  abnormality PSYCH: pleasant and cooperative,  no obvious depression or anxiety  ASSESSMENT AND PLAN:  Discussed the following assessment and plan:  Right-sided chest pain  Painful lumpy right breast ? - see text  Encounter for immunization - Plan: Flu Vaccine QUAD 36+ mos IM Right chest wall versus upper outer breast tissue discomfort pain and tenderness or soreness. It could be premenstrual breast tissue versus pectoral muscle symptoms. She does have pendulous breasts. Discussed follow-up breast exam after her next period if she is having continued symptoms. Her exam is reassuring today otherwise. She can cut back on caffeine. Okay to give flu vaccine today. -Patient advised to return or notify health care team  if symptoms worsen ,persist or new concerns arise.  Patient Instructions  This could be a combination of premenstrual breast hormonal lumpiness and pectoral  muscle strain. If you're still having symptoms or feeling anything a week after your period starts then keep appointment for breast exam with me. Otherwise you can  cancel it and let us know how things are doing. Sometimes you can get tenderness of the knee area cystic changes premenstrually because of hormonal changes. I do not feel a specific lump today.    Neta MendsWanda K. Osher Oettinger M.D.

## 2016-10-14 ENCOUNTER — Ambulatory Visit (INDEPENDENT_AMBULATORY_CARE_PROVIDER_SITE_OTHER): Payer: BLUE CROSS/BLUE SHIELD | Admitting: Internal Medicine

## 2016-10-14 ENCOUNTER — Encounter: Payer: Self-pay | Admitting: Internal Medicine

## 2016-10-14 VITALS — BP 128/90 | Temp 98.6°F | Wt 212.0 lb

## 2016-10-14 DIAGNOSIS — N644 Mastodynia: Secondary | ICD-10-CM

## 2016-10-14 DIAGNOSIS — R079 Chest pain, unspecified: Secondary | ICD-10-CM | POA: Diagnosis not present

## 2016-10-14 DIAGNOSIS — Z23 Encounter for immunization: Secondary | ICD-10-CM | POA: Diagnosis not present

## 2016-10-14 DIAGNOSIS — N6310 Unspecified lump in the right breast, unspecified quadrant: Secondary | ICD-10-CM | POA: Diagnosis not present

## 2016-10-14 NOTE — Patient Instructions (Signed)
This could be a combination of premenstrual breast hormonal lumpiness and pectoral  muscle strain. If you're still having symptoms or feeling anything a week after your period starts then keep appointment for breast exam with me. Otherwise you can cancel it and let us know how things are doing. Sometimes you can get tenderness of the knee area cystic changes premenstrually because of hormonal changes. I do not feel a specific lump today.

## 2016-10-16 DIAGNOSIS — R102 Pelvic and perineal pain: Secondary | ICD-10-CM | POA: Diagnosis not present

## 2016-12-03 DIAGNOSIS — N760 Acute vaginitis: Secondary | ICD-10-CM | POA: Diagnosis not present

## 2017-03-20 ENCOUNTER — Ambulatory Visit (INDEPENDENT_AMBULATORY_CARE_PROVIDER_SITE_OTHER): Payer: BLUE CROSS/BLUE SHIELD | Admitting: Internal Medicine

## 2017-03-20 ENCOUNTER — Encounter: Payer: Self-pay | Admitting: Internal Medicine

## 2017-03-20 VITALS — BP 102/76 | HR 76 | Temp 98.3°F | Wt 209.4 lb

## 2017-03-20 DIAGNOSIS — N760 Acute vaginitis: Secondary | ICD-10-CM

## 2017-03-20 DIAGNOSIS — N898 Other specified noninflammatory disorders of vagina: Secondary | ICD-10-CM

## 2017-03-20 LAB — POC URINALSYSI DIPSTICK (AUTOMATED)
BILIRUBIN UA: NEGATIVE
Glucose, UA: NEGATIVE
KETONES UA: NEGATIVE
Leukocytes, UA: NEGATIVE
Nitrite, UA: NEGATIVE
PH UA: 6 (ref 5.0–8.0)
Protein, UA: NEGATIVE
Spec Grav, UA: 1.02 (ref 1.010–1.025)
Urobilinogen, UA: 0.2 E.U./dL

## 2017-03-20 MED ORDER — FLUCONAZOLE 150 MG PO TABS: 150.0000 mg | ORAL_TABLET | Freq: Once | ORAL | 0 refills | Status: AC

## 2017-03-20 NOTE — Addendum Note (Signed)
Addended by: Dominic PeaHOLSEY, Antonia Culbertson V on: 03/20/2017 03:36 PM   Modules accepted: Orders

## 2017-03-20 NOTE — Progress Notes (Signed)
Chief Complaint  Patient presents with  . Vaginal Discharge    HPI: April Levine 46 y.o.  sda     Gyne had no appt today they close early   Complaint 1-2 days of thick white cottage cheese discharge with some slight irritation. No fever abdominal pain just finished her period she has had yeast infections in the past Last one treated with otc   5 mos ago  Monistat.  About 3 days .    Now  1 day.  Of sx  [facility] to [facility] appointment She comes in today because she thinks" Diflucan works best. " No uti sx  Concern about sti   iud ROS: See pertinent positives and negatives per HPI.  Past Medical History:  Diagnosis Date  . Anemia   . Anemia   . Chicken pox   . DVT (deep vein thrombosis) in pregnancy (HCC) 2003    and post surgery left leg  knee in preg  . MRSA (methicillin resistant staph aureus) culture positive     Family History  Problem Relation Age of Onset  . Alcohol abuse Other   . Arthritis Mother   . Hypertension Mother   . Dementia Father   . Alcohol abuse Maternal Grandmother   . Pulmonary embolism Mother   . Stroke Father     Social History   Social History  . Marital status: Single    Spouse name: N/A  . Number of children: N/A  . Years of education: N/A   Social History Main Topics  . Smoking status: Never Smoker  . Smokeless tobacco: Never Used  . Alcohol use 0.0 oz/week     Comment: occ  . Drug use: No  . Sexual activity: Yes    Birth control/ protection: IUD     Comment: paraguard   Other Topics Concern  . None   Social History Narrative   Regular exercise-No   7-8 hours of sleep per night   3 people in the home  18 and 11    Has 1 dog.   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   No facility-administered medications prior to visit.       EXAM:  BP 102/76 (BP Location: Right Arm, Patient Position: Sitting, Cuff Size: Large)   Pulse 76   Temp 98.3 F (36.8 C) (Oral)   Wt 209 lb 6.4 oz (95 kg)   BMI 31.38 kg/m   Body mass index is 31.38 kg/m.  GENERAL: vitals reviewed and listed above, alert, oriented, appears well hydrated and in no acute distress HEENT: atraumatic, conjunctiva  clear, no obvious abnormalities on inspection of external nose and ears  abd soft ext gu nl min redness no lesion  Dx os no  Mucopus  Whit dc min redness  PSYCH: pleasant and cooperative, no obvious depression or anxiety Lab Results  Component Value Date   WBC 5.7 04/14/2016   HGB 10.6 (L) 04/14/2016   HCT 32.8 (L) 04/14/2016   PLT 279.0 04/14/2016   GLUCOSE 107 (H) 04/14/2016   CHOL 185 06/15/2013   TRIG 83.0 06/15/2013   HDL 71.80 06/15/2013  LDLCALC 97 06/15/2013   ALT 10 06/15/2013   AST 17 06/15/2013   NA 139 04/14/2016   K 3.5 04/14/2016   CL 106 04/14/2016   CREATININE 0.88 04/14/2016   BUN 11 04/14/2016   CO2 25 04/14/2016   TSH 1.64 04/14/2016   HGBA1C 5.9 04/14/2016    ASSESSMENT AND PLAN:  Discussed the following assessment and plan:  Vaginal discharge  Acute vaginitis Nonspecific finding lab tests not done today impaired treatment for Diflucan not high risk for other causes follow-up with GYN exam Pap etc. There are other treatments for recurrent yeast. Reviewed lab work done last year she does not have diabetes. -Patient advised to return or notify health care team  if symptoms worsen ,persist or new concerns arise.  Patient Instructions  Exam ins non specific  But we didn't do a lab test  today .  Sending in diflucan today  And make appt for routine fu with your gyne    Or if not better .    Vaginitis Vaginitis is a condition in which the vaginal tissue swells and becomes red (inflamed). This condition is most often caused by a change in the normal balance of bacteria and yeast that live in the  vagina. This change causes an overgrowth of certain bacteria or yeast, which causes the inflammation. There are different types of vaginitis, but the most common types are:  Bacterial vaginosis.  Yeast infection (candidiasis).  Trichomoniasis vaginitis. This is a sexually transmitted disease (STD).  Viral vaginitis.  Atrophic vaginitis.  Allergic vaginitis.  What are the causes? The cause of this condition depends on the type of vaginitis. It can be caused by:  Bacteria (bacterial vaginosis).  Yeast, which is a fungus (yeast infection).  A parasite (trichomoniasis vaginitis).  A virus (viral vaginitis).  Low hormone levels (atrophic vaginitis). Low hormone levels can occur during pregnancy, breastfeeding, or after menopause.  Irritants, such as bubble baths, scented tampons, and feminine sprays (allergic vaginitis).  Other factors can change the normal balance of the yeast and bacteria that live in the vagina. These include:  Antibiotic medicines.  Poor hygiene.  Diaphragms, vaginal sponges, spermicides, birth control pills, and intrauterine devices (IUD).  Sex.  Infection.  Uncontrolled diabetes.  A weakened defense (immune) system.  What increases the risk? This condition is more likely to develop in women who:  Smoke.  Use vaginal douches, scented tampons, or scented sanitary pads.  Wear tight-fitting pants.  Wear thong underwear.  Use oral birth control pills or an IUD.  Have sex without a condom.  Have multiple sex partners.  Have an STD.  Frequently use the spermicide nonoxynol-9.  Eat lots of foods high in sugar.  Have uncontrolled diabetes.  Have low estrogen levels.  Have a weakened immune system from an immune disorder or medical treatment.  Are pregnant or breastfeeding.  What are the signs or symptoms? Symptoms vary depending on the cause of the vaginitis. Common symptoms include:  Abnormal vaginal discharge. ? The discharge  is white, gray, or yellow with bacterial vaginosis. ? The discharge is thick, white, and cheesy with a yeast infection. ? The discharge is frothy and yellow or greenish with trichomoniasis.  A bad vaginal smell. The smell is fishy with bacterial vaginosis.  Vaginal itching, pain, or swelling.  Sex that is painful.  Pain or burning when urinating.  Sometimes there are no symptoms. How is this diagnosed? This condition is diagnosed based on your symptoms and medical history. A physical  exam, including a pelvic exam, will also be done. You may also have other tests, including:  Tests to determine the pH level (acidity or alkalinity) of your vagina.  A whiff test, to assess the odor that results when a sample of your vaginal discharge is mixed with a potassium hydroxide solution.  Tests of vaginal fluid. A sample will be examined under a microscope.  How is this treated? Treatment varies depending on the type of vaginitis you have. Your treatment may include:  Antibiotic creams or pills to treat bacterial vaginosis and trichomoniasis.  Antifungal medicines, such as vaginal creams or suppositories, to treat a yeast infection.  Medicine to ease discomfort if you have viral vaginitis. Your sexual partner should also be treated.  Estrogen delivered in a cream, pill, suppository, or vaginal ring to treat atrophic vaginitis. If vaginal dryness occurs, lubricants and moisturizing creams may help. You may need to avoid scented soaps, sprays, or douches.  Stopping use of a product that is causing allergic vaginitis. Then using a vaginal cream to treat the symptoms.  Follow these instructions at home: Lifestyle  Keep your genital area clean and dry. Avoid soap, and only rinse the area with water.  Do not douche or use tampons until your health care provider says it is okay to do so. Use sanitary pads, if needed.  Do not have sex until your health care provider approves. When you can  return to sex, practice safe sex and use condoms.  Wipe from front to back. This avoids the spread of bacteria from the rectum to the vagina. General instructions  Take over-the-counter and prescription medicines only as told by your health care provider.  If you were prescribed an antibiotic medicine, take or use it as told by your health care provider. Do not stop taking or using the antibiotic even if you start to feel better.  Keep all follow-up visits as told by your health care provider. This is important. How is this prevented?  Use mild, non-scented products. Do not use things that can irritate the vagina, such as fabric softeners. Avoid the following products if they are scented: ? Feminine sprays. ? Detergents. ? Tampons. ? Feminine hygiene products. ? Soaps or bubble baths.  Let air reach your genital area. ? Wear cotton underwear to reduce moisture buildup. ? Avoid wearing underwear while you sleep. ? Avoid wearing tight pants and underwear or nylons without a cotton panel. ? Avoid wearing thong underwear.  Take off any wet clothing, such as bathing suits, as soon as possible.  Practice safe sex and use condoms. Contact a health care provider if:  You have abdominal pain.  You have a fever.  You have symptoms that last for more than 2-3 days. Get help right away if:  You have a fever and your symptoms suddenly get worse. Summary  Vaginitis is a condition in which the vaginal tissue becomes inflamed.This condition is most often caused by a change in the normal balance of bacteria and yeast that live in the vagina.  Treatment varies depending on the type of vaginitis you have.  Do not douche, use tampons , or have sex until your health care provider approves. When you can return to sex, practice safe sex and use condoms. This information is not intended to replace advice given to you by your health care provider. Make sure you discuss any questions you have with  your health care provider. Document Released: 06/08/2007 Document Revised: 09/16/2016 Document Reviewed: 09/16/2016 Elsevier  Interactive Patient Education  [employer].      [place] K. Kiyoto Slomski M.D.

## 2017-03-20 NOTE — Patient Instructions (Addendum)
Exam ins non specific  But we didn't do a lab test  today .  Sending in diflucan today  And make appt for routine fu with your gyne    Or if not better .    Vaginitis Vaginitis is a condition in which the vaginal tissue swells and becomes red (inflamed). This condition is most often caused by a change in the normal balance of bacteria and yeast that live in the vagina. This change causes an overgrowth of certain bacteria or yeast, which causes the inflammation. There are different types of vaginitis, but the most common types are:  Bacterial vaginosis.  Yeast infection (candidiasis).  Trichomoniasis vaginitis. This is a sexually transmitted disease (STD).  Viral vaginitis.  Atrophic vaginitis.  Allergic vaginitis.  What are the causes? The cause of this condition depends on the type of vaginitis. It can be caused by:  Bacteria (bacterial vaginosis).  Yeast, which is a fungus (yeast infection).  A parasite (trichomoniasis vaginitis).  A virus (viral vaginitis).  Low hormone levels (atrophic vaginitis). Low hormone levels can occur during pregnancy, breastfeeding, or after menopause.  Irritants, such as bubble baths, scented tampons, and feminine sprays (allergic vaginitis).  Other factors can change the normal balance of the yeast and bacteria that live in the vagina. These include:  Antibiotic medicines.  Poor hygiene.  Diaphragms, vaginal sponges, spermicides, birth control pills, and intrauterine devices (IUD).  Sex.  Infection.  Uncontrolled diabetes.  A weakened defense (immune) system.  What increases the risk? This condition is more likely to develop in women who:  Smoke.  Use vaginal douches, scented tampons, or scented sanitary pads.  Wear tight-fitting pants.  Wear thong underwear.  Use oral birth control pills or an IUD.  Have sex without a condom.  Have multiple sex partners.  Have an STD.  Frequently use the spermicide  nonoxynol-9.  Eat lots of foods high in sugar.  Have uncontrolled diabetes.  Have low estrogen levels.  Have a weakened immune system from an immune disorder or medical treatment.  Are pregnant or breastfeeding.  What are the signs or symptoms? Symptoms vary depending on the cause of the vaginitis. Common symptoms include:  Abnormal vaginal discharge. ? The discharge is white, gray, or yellow with bacterial vaginosis. ? The discharge is thick, white, and cheesy with a yeast infection. ? The discharge is frothy and yellow or greenish with trichomoniasis.  A bad vaginal smell. The smell is fishy with bacterial vaginosis.  Vaginal itching, pain, or swelling.  Sex that is painful.  Pain or burning when urinating.  Sometimes there are no symptoms. How is this diagnosed? This condition is diagnosed based on your symptoms and medical history. A physical exam, including a pelvic exam, will also be done. You may also have other tests, including:  Tests to determine the pH level (acidity or alkalinity) of your vagina.  A whiff test, to assess the odor that results when a sample of your vaginal discharge is mixed with a potassium hydroxide solution.  Tests of vaginal fluid. A sample will be examined under a microscope.  How is this treated? Treatment varies depending on the type of vaginitis you have. Your treatment may include:  Antibiotic creams or pills to treat bacterial vaginosis and trichomoniasis.  Antifungal medicines, such as vaginal creams or suppositories, to treat a yeast infection.  Medicine to ease discomfort if you have viral vaginitis. Your sexual partner should also be treated.  Estrogen delivered in a cream, pill, suppository, or  vaginal ring to treat atrophic vaginitis. If vaginal dryness occurs, lubricants and moisturizing creams may help. You may need to avoid scented soaps, sprays, or douches.  Stopping use of a product that is causing allergic vaginitis.  Then using a vaginal cream to treat the symptoms.  Follow these instructions at home: Lifestyle  Keep your genital area clean and dry. Avoid soap, and only rinse the area with water.  Do not douche or use tampons until your health care provider says it is okay to do so. Use sanitary pads, if needed.  Do not have sex until your health care provider approves. When you can return to sex, practice safe sex and use condoms.  Wipe from front to back. This avoids the spread of bacteria from the rectum to the vagina. General instructions  Take over-the-counter and prescription medicines only as told by your health care provider.  If you were prescribed an antibiotic medicine, take or use it as told by your health care provider. Do not stop taking or using the antibiotic even if you start to feel better.  Keep all follow-up visits as told by your health care provider. This is important. How is this prevented?  Use mild, non-scented products. Do not use things that can irritate the vagina, such as fabric softeners. Avoid the following products if they are scented: ? Feminine sprays. ? Detergents. ? Tampons. ? Feminine hygiene products. ? Soaps or bubble baths.  Let air reach your genital area. ? Wear cotton underwear to reduce moisture buildup. ? Avoid wearing underwear while you sleep. ? Avoid wearing tight pants and underwear or nylons without a cotton panel. ? Avoid wearing thong underwear.  Take off any wet clothing, such as bathing suits, as soon as possible.  Practice safe sex and use condoms. Contact a health care provider if:  You have abdominal pain.  You have a fever.  You have symptoms that last for more than 2-3 days. Get help right away if:  You have a fever and your symptoms suddenly get worse. Summary  Vaginitis is a condition in which the vaginal tissue becomes inflamed.This condition is most often caused by a change in the normal balance of bacteria and yeast  that live in the vagina.  Treatment varies depending on the type of vaginitis you have.  Do not douche, use tampons , or have sex until your health care provider approves. When you can return to sex, practice safe sex and use condoms. This information is not intended to replace advice given to you by your health care provider. Make sure you discuss any questions you have with your health care provider. Document Released: 06/08/2007 Document Revised: 09/16/2016 Document Reviewed: 09/16/2016 Elsevier Interactive Patient Education  [employer]2018 [employer]

## 2017-04-29 DIAGNOSIS — B373 Candidiasis of vulva and vagina: Secondary | ICD-10-CM | POA: Diagnosis not present

## 2017-05-20 DIAGNOSIS — Z6833 Body mass index (BMI) 33.0-33.9, adult: Secondary | ICD-10-CM | POA: Diagnosis not present

## 2017-05-20 DIAGNOSIS — Z30431 Encounter for routine checking of intrauterine contraceptive device: Secondary | ICD-10-CM | POA: Diagnosis not present

## 2017-05-20 DIAGNOSIS — Z124 Encounter for screening for malignant neoplasm of cervix: Secondary | ICD-10-CM | POA: Diagnosis not present

## 2017-05-20 DIAGNOSIS — Z130 Encounter for screening for diseases of the blood and blood-forming organs and certain disorders involving the immune mechanism: Secondary | ICD-10-CM | POA: Diagnosis not present

## 2017-05-20 DIAGNOSIS — Z1231 Encounter for screening mammogram for malignant neoplasm of breast: Secondary | ICD-10-CM | POA: Diagnosis not present

## 2017-05-20 DIAGNOSIS — Z1389 Encounter for screening for other disorder: Secondary | ICD-10-CM | POA: Diagnosis not present

## 2017-05-20 DIAGNOSIS — Z01419 Encounter for gynecological examination (general) (routine) without abnormal findings: Secondary | ICD-10-CM | POA: Diagnosis not present

## 2017-08-07 DIAGNOSIS — N898 Other specified noninflammatory disorders of vagina: Secondary | ICD-10-CM | POA: Diagnosis not present

## 2017-10-01 DIAGNOSIS — N760 Acute vaginitis: Secondary | ICD-10-CM | POA: Diagnosis not present

## 2018-05-21 ENCOUNTER — Encounter (HOSPITAL_COMMUNITY): Payer: Self-pay

## 2018-05-21 ENCOUNTER — Emergency Department (EMERGENCY_DEPARTMENT_HOSPITAL): Payer: BLUE CROSS/BLUE SHIELD

## 2018-05-21 ENCOUNTER — Emergency Department (HOSPITAL_COMMUNITY)
Admission: EM | Admit: 2018-05-21 | Discharge: 2018-05-21 | Disposition: A | Discharge status: Home / Self Care | Payer: BLUE CROSS/BLUE SHIELD | Attending: Emergency Medicine | Admitting: Emergency Medicine

## 2018-05-21 DIAGNOSIS — R600 Localized edema: Secondary | ICD-10-CM | POA: Diagnosis not present

## 2018-05-21 DIAGNOSIS — R2242 Localized swelling, mass and lump, left lower limb: Secondary | ICD-10-CM | POA: Diagnosis present

## 2018-05-21 DIAGNOSIS — Z79899 Other long term (current) drug therapy: Secondary | ICD-10-CM | POA: Insufficient documentation

## 2018-05-21 DIAGNOSIS — M79652 Pain in left thigh: Secondary | ICD-10-CM | POA: Insufficient documentation

## 2018-05-21 NOTE — ED Provider Notes (Signed)
[facility]-EMERGENCY DEPT Provider Note   CSN: [id number] Arrival date & time: 05/21/18  1055     History   Chief Complaint Chief Complaint  Patient presents with  . Leg Swelling    HPI April Levine is a 47 y.o. female.  HPI   47 year old female with left lower extremity swelling. Has a mild ache in her thigh and "charlie horses" in her calf. Noticed yesterday and progressively worsening.  She is currently being evaluated by her PCP for her left lower back/left hip pain.  She is actually scheduled for CT today but was advised to come the emergency room when she called them about this chief complaint.  She denies any trauma.  No fevers or chills.  She does have a past history of DVT 16 years ago while she was pregnant.  She has not been anticoagulated since that time.  No numbness or tingling.  No acute respiratory complaints.  Past Medical History:  Diagnosis Date  . Anemia   . Anemia   . Chicken pox   . DVT (deep vein thrombosis) in pregnancy (HCC) 2003    and post surgery left leg  knee in preg  . MRSA (methicillin resistant staph aureus) culture positive     Patient Active Problem List   Diagnosis Date Noted  . Anemia, iron deficiency 09/30/2013  . Visit for preventive health examination 06/15/2013  . Abscess of abdominal wall 07/25/2011  . Foot pain, right 12/30/2010  . THYROID CYST 04/02/2010  . GOITER, UNSPECIFIED 03/11/2010  . ANEMIA-NOS 03/12/2009    Past Surgical History:  Procedure Laterality Date  . KNEE ARTHROSCOPY     had dvt post op     OB History    Gravida  6   Para  2   Term      Preterm      AB      Living        SAB      TAB      Ectopic      Multiple      Live Births               Home Medications    Prior to Admission medications   Medication Sig Start Date End Date Taking? Authorizing Provider  cyclobenzaprine (FLEXERIL) 10 MG tablet Take 10 mg by mouth 3 (three) times daily. 05/19/18  Yes  [provider]  ferrous sulfate 325 (65 FE) MG tablet Take 325 mg by mouth 3 (three) times daily. 04/02/18  Yes [provider]  HYDROcodone-acetaminophen (NORCO/VICODIN) 5-325 MG tablet Take 1 tablet by mouth 4 (four) times daily as needed for pain. 05/19/18  Yes [provider]  ibuprofen (ADVIL,MOTRIN) 800 MG tablet Take 800 mg by mouth 2 (two) times daily as needed for mild pain. 05/19/18  Yes [provider]  predniSONE (STERAPRED UNI-PAK 21 TAB) 5 MG (21) TBPK tablet Take by mouth as directed. 05/19/18  Yes [provider]  Vitamin D, Ergocalciferol, (DRISDOL) 50000 units CAPS capsule Take 50,000 Units by mouth once a week. 04/27/18  Yes [provider]    Family History Family History  Problem Relation Age of Onset  . Alcohol abuse Other   . Arthritis Mother   . Hypertension Mother   . Pulmonary embolism Mother   . Dementia Father   . Stroke Father   . Alcohol abuse Maternal Grandmother     Social History Social History   Tobacco Use  .  Smoking status: Never Smoker  . Smokeless tobacco: Never Used  Substance Use Topics  . Alcohol use: Yes    Alcohol/week: 0.0 standard drinks    Comment: occ  . Drug use: No     Allergies   Patient has no known allergies.   Review of Systems Review of Systems  All systems reviewed and negative, other than as noted in HPI.  Physical Exam Updated Vital Signs BP (!) 144/99 (BP Location: Right Arm)   Pulse 73   Temp 98.7 F (37.1 C) (Oral)   Resp 20   Ht 5' 7.5" (1.715 m)   Wt 96.2 kg   LMP 05/15/2018 (Approximate)   SpO2 100%   BMI 32.71 kg/m   Physical Exam  Constitutional: She appears well-developed and well-nourished. No distress.  HENT:  Head: Normocephalic and atraumatic.  Eyes: Conjunctivae are normal. Right eye exhibits no discharge. Left eye exhibits no discharge.  Neck: Neck supple.  Cardiovascular: Normal rate, regular rhythm and normal heart sounds. Exam  reveals no gallop and no friction rub.  No murmur heard. Pulmonary/Chest: Effort normal and breath sounds normal. No respiratory distress.  Abdominal: Soft. She exhibits no distension. There is no tenderness.  Musculoskeletal: She exhibits edema. She exhibits no tenderness.  Mild swelling of the left lower extremity as compared to the right.  No calf tenderness.  Negative Homans.  Easily palpable DP pulses bilaterally.  Sensation intact light touch.  Neurological: She is alert.  Skin: Skin is warm and dry.  Psychiatric: She has a normal mood and affect. Her behavior is normal. Thought content normal.  Nursing note and vitals reviewed.    ED Treatments / Results  Labs (all labs ordered are listed, but only abnormal results are displayed) Labs Reviewed - No data to display  EKG None  Radiology No results found.  Procedures Procedures (including critical care time)  Medications Ordered in ED Medications - No data to display   Initial Impression / Assessment and Plan / ED Course  I have reviewed the triage vital signs and the nursing notes.  Pertinent labs & imaging results that were available during my care of the patient were reviewed by me and considered in my medical decision making (see chart for details).     47 year old female with asymmetric swelling in lower extremities left greater than right.  Ultrasound negative for DVT.  Ongoing hip/lower back pain currently being worked up as an outpatient.  I doubt emergent process.  She is neurovascularly intact.  Plan continue symptomatic treatment.  Return precautions discussed.  Final Clinical Impressions(s) / ED Diagnoses   Final diagnoses:  Leg edema    ED Discharge Orders    None       Raeford Razor, MD 05/24/18 1426

## 2018-05-21 NOTE — Progress Notes (Signed)
*  Preliminary Results* Left lower extremity venous duplex completed. Visualized left lower extremity is negative for deep vein thrombosis. There is no evidence of left Baker's cyst. Results given to RN.   05/21/2018 12:03 PM  Aundra Millet Clare Gandy

## 2018-05-21 NOTE — Discharge Instructions (Addendum)
Your ultrasound is negative for a blood clot. I'm not sure if they swelling you are having is related to your hip pain or not. I want you to reschedule your CT. Continue to take your prescribed pain medications as needed and follow-up with your PCP.

## 2018-05-21 NOTE — ED Triage Notes (Addendum)
Pt presents with c/o left leg swelling. Pt reports that the swelling in her left leg started yesterday, hx of DVT. Pt also has some minor swelling in her right leg although the swelling in her left leg is more significant.

## 2018-08-12 DIAGNOSIS — R82998 Other abnormal findings in urine: Secondary | ICD-10-CM | POA: Diagnosis not present

## 2018-08-12 DIAGNOSIS — N390 Urinary tract infection, site not specified: Secondary | ICD-10-CM | POA: Diagnosis not present

## 2018-08-12 DIAGNOSIS — N760 Acute vaginitis: Secondary | ICD-10-CM | POA: Diagnosis not present

## 2018-09-08 DIAGNOSIS — F329 Major depressive disorder, single episode, unspecified: Secondary | ICD-10-CM | POA: Diagnosis not present

## 2018-09-10 DIAGNOSIS — F329 Major depressive disorder, single episode, unspecified: Secondary | ICD-10-CM | POA: Diagnosis not present

## 2018-09-12 DIAGNOSIS — F329 Major depressive disorder, single episode, unspecified: Secondary | ICD-10-CM | POA: Diagnosis not present

## 2018-09-21 DIAGNOSIS — F411 Generalized anxiety disorder: Secondary | ICD-10-CM | POA: Diagnosis not present

## 2018-09-21 DIAGNOSIS — F329 Major depressive disorder, single episode, unspecified: Secondary | ICD-10-CM | POA: Diagnosis not present

## 2018-09-23 DIAGNOSIS — F411 Generalized anxiety disorder: Secondary | ICD-10-CM | POA: Diagnosis not present

## 2018-09-23 DIAGNOSIS — F329 Major depressive disorder, single episode, unspecified: Secondary | ICD-10-CM | POA: Diagnosis not present

## 2018-10-09 DIAGNOSIS — F329 Major depressive disorder, single episode, unspecified: Secondary | ICD-10-CM | POA: Diagnosis not present

## 2018-10-09 DIAGNOSIS — F411 Generalized anxiety disorder: Secondary | ICD-10-CM | POA: Diagnosis not present

## 2018-10-11 DIAGNOSIS — F329 Major depressive disorder, single episode, unspecified: Secondary | ICD-10-CM | POA: Diagnosis not present

## 2018-10-11 DIAGNOSIS — F411 Generalized anxiety disorder: Secondary | ICD-10-CM | POA: Diagnosis not present

## 2018-11-06 DIAGNOSIS — F329 Major depressive disorder, single episode, unspecified: Secondary | ICD-10-CM | POA: Diagnosis not present

## 2018-11-06 DIAGNOSIS — F411 Generalized anxiety disorder: Secondary | ICD-10-CM | POA: Diagnosis not present

## 2018-11-08 DIAGNOSIS — F329 Major depressive disorder, single episode, unspecified: Secondary | ICD-10-CM | POA: Diagnosis not present

## 2018-11-08 DIAGNOSIS — F411 Generalized anxiety disorder: Secondary | ICD-10-CM | POA: Diagnosis not present

## 2019-04-07 DIAGNOSIS — N760 Acute vaginitis: Secondary | ICD-10-CM | POA: Diagnosis not present

## 2019-04-07 DIAGNOSIS — Z113 Encounter for screening for infections with a predominantly sexual mode of transmission: Secondary | ICD-10-CM | POA: Diagnosis not present

## 2019-05-16 DIAGNOSIS — Z1231 Encounter for screening mammogram for malignant neoplasm of breast: Secondary | ICD-10-CM | POA: Diagnosis not present

## 2019-05-16 DIAGNOSIS — Z01419 Encounter for gynecological examination (general) (routine) without abnormal findings: Secondary | ICD-10-CM | POA: Diagnosis not present

## 2019-05-16 DIAGNOSIS — D649 Anemia, unspecified: Secondary | ICD-10-CM | POA: Diagnosis not present

## 2019-05-16 DIAGNOSIS — Z6831 Body mass index (BMI) 31.0-31.9, adult: Secondary | ICD-10-CM | POA: Diagnosis not present

## 2019-05-24 NOTE — Progress Notes (Signed)
Chief Complaint  Patient presents with  . Abdominal Pain    on lower right side pt states that its a dull and constant pain thats been going on for 3 months and states she can not sleep on that side or leg sometimes     HPI: April Levine 48 y.o. come in for on going  3 months of right lower abd lateral area back pain   Pain for over 3 months    sometimes down leg groin area .   Continuous with waxing and waning     Worse w sudden movement   But no limping or worsening with walk  No injury    Pain level    Moving around  A lot 8 /10 [place]  3/10  When quiet  Hurts to sleep on the area.   .   Has seen  Gyne last week Mc comb and pelvic TVus planned  But he thinks could be gi related   No gi sx blood  Change and bowel habit change.   April Levine   Did colonoscopy   In recent past  Due for fu    10 years.   Has  Edema both legs since last year   See ed visit  No dvt  Dec in am worse in pm   Bilateral wants to make sure not her heart  ot other issue  Was told low iron from her gyne  Has iud and heavy bleeding   HH of 2 pets  Secondary school teacher  Sleep  Benadryl 2-3 hours  Some sweet drings/    ROS: See pertinent positives and negatives per HPI. No sob cough had chest tightness after drinking wine but  No doe CP .   Past Medical History:  Diagnosis Date  . Anemia   . Anemia   . Chicken pox   . DVT (deep vein thrombosis) in pregnancy ([place]) 2003    and post surgery left leg  knee in preg  . MRSA (methicillin resistant staph aureus) culture positive     Family History  Problem Relation Age of Onset  . Alcohol abuse Other   . Arthritis Mother   . Hypertension Mother   . Pulmonary embolism Mother   . Dementia Father   . Stroke Father   . Alcohol abuse Maternal Grandmother     Social History   Socioeconomic History  . Marital status: Single    Spouse name: Not on file  . Number of children: Not on file  . Years of education: Not on file  . Highest education level: Not on  file  Occupational History  . Not on file  Social Needs  . Financial resource strain: Not on file  . Food insecurity    Worry: Not on file    Inability: Not on file  . Transportation needs    Medical: Not on file    Non-medical: Not on file  Tobacco Use  . Smoking status: Never Smoker  . Smokeless tobacco: Never Used  Substance and Sexual Activity  . Alcohol use: Yes    Alcohol/week: 0.0 standard drinks    Comment: occ  . Drug use: No  . Sexual activity: Yes    Birth control/protection: I.U.D.    Comment: paraguard  Lifestyle  . Physical activity    Days per week: Not on file    Minutes per session: Not on file  . Stress: Not on file  Relationships  . Social connections  Talks on phone: Not on file    Gets together: Not on file    Attends religious service: Not on file    Active member of club or organization: Not on file    Attends meetings of clubs or organizations: Not on file    Relationship status: Not on file  Other Topics Concern  . Not on file  Social History Narrative   Regular exercise-No   7-8 hours of sleep per night   3 people in the home  18 and 11    Has 1 dog.   Poly sci  Degree and and MBA    From [employer]  9-5    On call ups tracking    caffiene ocass    etoh    6 preg 2 Live births             Outpatient Medications Prior to Visit  Medication Sig Dispense Refill  . ferrous sulfate 325 (65 FE) MG tablet Take 325 mg by mouth 3 (three) times daily.  0  . cyclobenzaprine (FLEXERIL) 10 MG tablet Take 10 mg by mouth 3 (three) times daily.    Marland Kitchen HYDROcodone-acetaminophen (NORCO/VICODIN) 5-325 MG tablet Take 1 tablet by mouth 4 (four) times daily as needed for pain.    Marland Kitchen ibuprofen (ADVIL,MOTRIN) 800 MG tablet Take 800 mg by mouth 2 (two) times daily as needed for mild pain.    . predniSONE (STERAPRED UNI-PAK 21 TAB) 5 MG (21) TBPK tablet Take by mouth as directed.    . Vitamin D, Ergocalciferol, (DRISDOL) 50000 units CAPS  capsule Take 50,000 Units by mouth once a week.  2   No facility-administered medications prior to visit.      EXAM:  BP 120/66 (BP Location: Right Arm, Patient Position: Sitting, Cuff Size: Normal)   Pulse 60   Temp 98 F (36.7 C) (Temporal)   Wt 202 lb 9.6 oz (91.9 kg)   SpO2 99%   BMI 31.26 kg/m   Body mass index is 31.26 kg/m.  GENERAL: vitals reviewed and listed above, alert, oriented, appears well hydrated and in no acute distress HEENT: atraumatic, conjunctiva  clear, no obvious abnormalities on inspection of external nose and ears OP masked  NECK: no obvious masses on inspection palpation  No jvd  LUNGS: clear to auscultation bilaterally, no wheezes, rales or rhonchi, good air movement CV: HRRR, no clubbing cyanosis 1+ edema  Bilateral nl cap refill  Abdomen:  Sof,t normal bowel sounds without hepatosplenomegaly, no guarding rebound or masses no CVA tenderness tenderness  Low r abd   Lateral near  Iliac crest and groin no masses and no hernia noted  When standing  ( points to low  MS: moves all extremities without noticeable focal  abnormality PSYCH: pleasant and cooperative, no obvious depression or anxiety Lab Results  Component Value Date   WBC 4.3 05/27/2019   HGB 11.1 (L) 05/27/2019   HCT 35.0 (L) 05/27/2019   PLT 212.0 05/27/2019   GLUCOSE 89 05/27/2019   CHOL 181 05/27/2019   TRIG 73.0 05/27/2019   HDL 54.90 05/27/2019   LDLCALC 111 (H) 05/27/2019   ALT 6 05/27/2019   AST 10 05/27/2019   NA 140 05/27/2019   K 4.1 05/27/2019   CL 106 05/27/2019   CREATININE 0.69 05/27/2019   BUN 13 05/27/2019   CO2 27 05/27/2019   TSH 0.71 05/27/2019   HGBA1C 5.9 04/14/2016   BP Readings from Last 3 Encounters:  05/27/19 120/66  05/21/18 (!) 143/91  03/20/17 102/76    ASSESSMENT AND PLAN:  Discussed the following assessment and plan:  Right lateral abdominal pain - Plan: Basic metabolic panel, CBC with Differential/Platelet, Hepatic function panel, Lipid  panel, TSH, T4, free, IBC + Ferritin, Sedimentation rate, C-reactive protein, POCT Urinalysis Dipstick (Automated)  Need for immunization against influenza - Plan: Flu Vaccine QUAD 36+ mos IM  Edema, unspecified type - Plan: Basic metabolic panel, CBC with Differential/Platelet, Hepatic function panel, Lipid panel, TSH, T4, free, IBC + Ferritin, Sedimentation rate, C-reactive protein, POCT Urinalysis Dipstick (Automated), Protein / creatinine ratio, urine  Anemia, unspecified type - Plan: Basic metabolic panel, CBC with Differential/Platelet, Hepatic function panel, Lipid panel, TSH, T4, free, IBC + Ferritin, Sedimentation rate, C-reactive protein, POCT Urinalysis Dipstick (Automated) R/o metabolic     If  Gyne eval not revealing then we may proceed with abd pelvic ct scan  .   -Patient advised to return or notify health care team  if  new concerns arise. In interim  Patient Instructions  Your exam is reassuring     Proceed with the ultrasound  And if no reason for the  Pain  We can proceed with  Abdominal pelvic ct scan .   could possible  be from back and hip problem . dont see a heart issue today .  Will notify you  of labs when available.   Then plan follow up after gyne evaluation.    Neta MendsWanda K.  M.D.

## 2019-05-27 ENCOUNTER — Other Ambulatory Visit: Payer: Self-pay

## 2019-05-27 ENCOUNTER — Ambulatory Visit: Payer: BC Managed Care – PPO | Admitting: Internal Medicine

## 2019-05-27 ENCOUNTER — Encounter: Payer: Self-pay | Admitting: Internal Medicine

## 2019-05-27 VITALS — BP 120/66 | HR 60 | Temp 98.0°F | Wt 202.6 lb

## 2019-05-27 DIAGNOSIS — R609 Edema, unspecified: Secondary | ICD-10-CM | POA: Diagnosis not present

## 2019-05-27 DIAGNOSIS — R109 Unspecified abdominal pain: Secondary | ICD-10-CM

## 2019-05-27 DIAGNOSIS — Z23 Encounter for immunization: Secondary | ICD-10-CM

## 2019-05-27 DIAGNOSIS — D649 Anemia, unspecified: Secondary | ICD-10-CM | POA: Diagnosis not present

## 2019-05-27 LAB — CBC WITH DIFFERENTIAL/PLATELET
Basophils Absolute: 0 10*3/uL (ref 0.0–0.1)
Basophils Relative: 0.6 % (ref 0.0–3.0)
Eosinophils Absolute: 0.1 10*3/uL (ref 0.0–0.7)
Eosinophils Relative: 2.3 % (ref 0.0–5.0)
HCT: 35 % — ABNORMAL LOW (ref 36.0–46.0)
Hemoglobin: 11.1 g/dL — ABNORMAL LOW (ref 12.0–15.0)
Lymphocytes Relative: 37.6 % (ref 12.0–46.0)
Lymphs Abs: 1.6 10*3/uL (ref 0.7–4.0)
MCHC: 31.9 g/dL (ref 30.0–36.0)
MCV: 88.5 fl (ref 78.0–100.0)
Monocytes Absolute: 0.4 10*3/uL (ref 0.1–1.0)
Monocytes Relative: 10.4 % (ref 3.0–12.0)
Neutro Abs: 2.1 10*3/uL (ref 1.4–7.7)
Neutrophils Relative %: 49.1 % (ref 43.0–77.0)
Platelets: 212 10*3/uL (ref 150.0–400.0)
RBC: 3.95 Mil/uL (ref 3.87–5.11)
RDW: 16.5 % — ABNORMAL HIGH (ref 11.5–15.5)
WBC: 4.3 10*3/uL (ref 4.0–10.5)

## 2019-05-27 LAB — HEPATIC FUNCTION PANEL
ALT: 6 U/L (ref 0–35)
AST: 10 U/L (ref 0–37)
Albumin: 4 g/dL (ref 3.5–5.2)
Alkaline Phosphatase: 71 U/L (ref 39–117)
Bilirubin, Direct: 0.1 mg/dL (ref 0.0–0.3)
Total Bilirubin: 0.4 mg/dL (ref 0.2–1.2)
Total Protein: 6.9 g/dL (ref 6.0–8.3)

## 2019-05-27 LAB — LIPID PANEL
Cholesterol: 181 mg/dL (ref 0–200)
HDL: 54.9 mg/dL (ref >39.00)
LDL Cholesterol: 111 mg/dL — ABNORMAL HIGH (ref 0–99)
NonHDL: 125.75
Total CHOL/HDL Ratio: 3
Triglycerides: 73 mg/dL (ref 0.0–149.0)
VLDL: 14.6 mg/dL (ref 0.0–40.0)

## 2019-05-27 LAB — POC URINALSYSI DIPSTICK (AUTOMATED)
Bilirubin, UA: NEGATIVE
Blood, UA: NEGATIVE
Glucose, UA: NEGATIVE
Ketones, UA: NEGATIVE
Leukocytes, UA: NEGATIVE
Nitrite, UA: NEGATIVE
Protein, UA: POSITIVE — AB
Spec Grav, UA: 1.015 (ref 1.010–1.025)
Urobilinogen, UA: 1 E.U./dL
pH, UA: 7 (ref 5.0–8.0)

## 2019-05-27 LAB — BASIC METABOLIC PANEL
BUN: 13 mg/dL (ref 6–23)
CO2: 27 mEq/L (ref 19–32)
Calcium: 9 mg/dL (ref 8.4–10.5)
Chloride: 106 mEq/L (ref 96–112)
Creatinine, Ser: 0.69 mg/dL (ref 0.40–1.20)
GFR: 109.89 mL/min (ref >60.00)
Glucose, Bld: 89 mg/dL (ref 70–99)
Potassium: 4.1 mEq/L (ref 3.5–5.1)
Sodium: 140 mEq/L (ref 135–145)

## 2019-05-27 LAB — SEDIMENTATION RATE: Sed Rate: 28 mm/hr — ABNORMAL HIGH (ref 0–20)

## 2019-05-27 LAB — C-REACTIVE PROTEIN: CRP: <1 mg/dL (ref 0.5–20.0)

## 2019-05-27 LAB — IBC + FERRITIN
Ferritin: 6.4 ng/mL — ABNORMAL LOW (ref 10.0–291.0)
Iron: 43 ug/dL (ref 42–145)
Saturation Ratios: 11.3 % — ABNORMAL LOW (ref 20.0–50.0)
Transferrin: 271 mg/dL (ref 212.0–360.0)

## 2019-05-27 LAB — T4, FREE: Free T4: 1.01 ng/dL (ref 0.60–1.60)

## 2019-05-27 LAB — TSH: TSH: 0.71 u[IU]/mL (ref 0.35–4.50)

## 2019-05-27 NOTE — Patient Instructions (Signed)
Your exam is reassuring     Proceed with the ultrasound  And if no reason for the  Pain  We can proceed with  Abdominal pelvic ct scan .   could possible  be from back and hip problem . dont see a heart issue today .  Will notify you  of labs when available.   Then plan follow up after gyne evaluation.

## 2019-05-28 LAB — PROTEIN / CREATININE RATIO, URINE
Creatinine, Urine: 208 mg/dL (ref 20–275)
Protein/Creat Ratio: 91 mg/g creat (ref 21–161)
Protein/Creatinine Ratio: 0.091 mg/mg creat (ref 0.021–0.16)
Total Protein, Urine: 19 mg/dL (ref 5–24)

## 2019-07-12 ENCOUNTER — Other Ambulatory Visit: Payer: Self-pay

## 2019-07-12 DIAGNOSIS — Z20822 Contact with and (suspected) exposure to covid-19: Secondary | ICD-10-CM

## 2019-07-13 LAB — NOVEL CORONAVIRUS, NAA: SARS-CoV-2, NAA: NOT DETECTED

## 2019-09-14 DIAGNOSIS — N921 Excessive and frequent menstruation with irregular cycle: Secondary | ICD-10-CM | POA: Diagnosis not present

## 2019-09-14 DIAGNOSIS — N939 Abnormal uterine and vaginal bleeding, unspecified: Secondary | ICD-10-CM | POA: Diagnosis not present

## 2019-09-14 DIAGNOSIS — N760 Acute vaginitis: Secondary | ICD-10-CM | POA: Diagnosis not present

## 2020-04-25 ENCOUNTER — Ambulatory Visit: Payer: BC Managed Care – PPO | Admitting: Internal Medicine

## 2020-05-15 DIAGNOSIS — Z20822 Contact with and (suspected) exposure to covid-19: Secondary | ICD-10-CM | POA: Diagnosis not present

## 2020-05-15 DIAGNOSIS — Z03818 Encounter for observation for suspected exposure to other biological agents ruled out: Secondary | ICD-10-CM | POA: Diagnosis not present

## 2020-06-14 ENCOUNTER — Ambulatory Visit: Payer: BC Managed Care – PPO | Admitting: Internal Medicine

## 2020-06-14 ENCOUNTER — Encounter: Payer: Self-pay | Admitting: Internal Medicine

## 2020-06-14 ENCOUNTER — Other Ambulatory Visit: Payer: Self-pay

## 2020-06-14 VITALS — BP 140/90 | HR 67 | Temp 98.3°F | Ht 67.5 in | Wt 196.9 lb

## 2020-06-14 DIAGNOSIS — Z23 Encounter for immunization: Secondary | ICD-10-CM | POA: Diagnosis not present

## 2020-06-14 DIAGNOSIS — E669 Obesity, unspecified: Secondary | ICD-10-CM | POA: Diagnosis not present

## 2020-06-14 DIAGNOSIS — R030 Elevated blood-pressure reading, without diagnosis of hypertension: Secondary | ICD-10-CM | POA: Diagnosis not present

## 2020-06-14 NOTE — Patient Instructions (Signed)
-Nice seeing you today!!  -Flu and tdap vaccines today.  -Check your blood pressure 2-3 times a week at home and bring measurements into your next visit.  -Low salt diet (see below).  -Schedule follow up in 6-8 weeks. Please come in fasting for labs.   DASH Eating Plan DASH stands for "Dietary Approaches to Stop Hypertension." The DASH eating plan is a healthy eating plan that has been shown to reduce high blood pressure (hypertension). It may also reduce your risk for type 2 diabetes, heart disease, and stroke. The DASH eating plan may also help with weight loss. What are tips for following this plan?  General guidelines  Avoid eating more than 2,300 mg (milligrams) of salt (sodium) a day. If you have hypertension, you may need to reduce your sodium intake to 1,500 mg a day.  Limit alcohol intake to no more than 1 drink a day for nonpregnant women and 2 drinks a day for men. One drink equals 12 oz of beer, 5 oz of wine, or 1 oz of hard liquor.  Work with your health care provider to maintain a healthy body weight or to lose weight. Ask what an ideal weight is for you.  Get at least 30 minutes of exercise that causes your heart to beat faster (aerobic exercise) most days of the week. Activities may include walking, swimming, or biking.  Work with your health care provider or diet and nutrition specialist (dietitian) to adjust your eating plan to your individual calorie needs. Reading food labels   Check food labels for the amount of sodium per serving. Choose foods with less than 5 percent of the Daily Value of sodium. Generally, foods with less than 300 mg of sodium per serving fit into this eating plan.  To find whole grains, look for the word "whole" as the first word in the ingredient list. Shopping  Buy products labeled as "low-sodium" or "no salt added."  Buy fresh foods. Avoid canned foods and premade or frozen meals. Cooking  Avoid adding salt when cooking. Use  salt-free seasonings or herbs instead of table salt or sea salt. Check with your health care provider or pharmacist before using salt substitutes.  Do not fry foods. Cook foods using healthy methods such as baking, boiling, grilling, and broiling instead.  Cook with heart-healthy oils, such as olive, canola, soybean, or sunflower oil. Meal planning  Eat a balanced diet that includes: ? 5 or more servings of fruits and vegetables each day. At each meal, try to fill half of your plate with fruits and vegetables. ? Up to 6-8 servings of whole grains each day. ? Less than 6 oz of lean meat, poultry, or fish each day. A 3-oz serving of meat is about the same size as a deck of cards. One egg equals 1 oz. ? 2 servings of low-fat dairy each day. ? A serving of nuts, seeds, or beans 5 times each week. ? Heart-healthy fats. Healthy fats called Omega-3 fatty acids are found in foods such as flaxseeds and coldwater fish, like sardines, salmon, and mackerel.  Limit how much you eat of the following: ? Canned or prepackaged foods. ? Food that is high in trans fat, such as fried foods. ? Food that is high in saturated fat, such as fatty meat. ? Sweets, desserts, sugary drinks, and other foods with added sugar. ? Full-fat dairy products.  Do not salt foods before eating.  Try to eat at least 2 vegetarian meals each week.  Eat more home-cooked food and less restaurant, buffet, and fast food.  When eating at a restaurant, ask that your food be prepared with less salt or no salt, if possible. What foods are recommended? The items listed may not be a complete list. Talk with your dietitian about what dietary choices are best for you. Grains Whole-grain or whole-wheat bread. Whole-grain or whole-wheat pasta. Brown rice. [place]. Bulgur. Whole-grain and low-sodium cereals. Pita bread. Low-fat, low-sodium crackers. Whole-wheat flour tortillas. Vegetables Fresh or frozen vegetables (raw, steamed,  roasted, or grilled). Low-sodium or reduced-sodium tomato and vegetable juice. Low-sodium or reduced-sodium tomato sauce and tomato paste. Low-sodium or reduced-sodium canned vegetables. Fruits All fresh, dried, or frozen fruit. Canned fruit in natural juice (without added sugar). Meat and other protein foods Skinless chicken or [place]. Ground chicken or [place]. Pork with fat trimmed off. Fish and seafood. Egg whites. Dried beans, peas, or lentils. Unsalted nuts, nut butters, and seeds. Unsalted canned beans. Lean cuts of beef with fat trimmed off. Low-sodium, lean deli meat. Dairy Low-fat (1%) or fat-free (skim) milk. Fat-free, low-fat, or reduced-fat cheeses. Nonfat, low-sodium ricotta or cottage cheese. Low-fat or nonfat yogurt. Low-fat, low-sodium cheese. Fats and oils Soft margarine without trans fats. Vegetable oil. Low-fat, reduced-fat, or light mayonnaise and salad dressings (reduced-sodium). Canola, safflower, olive, soybean, and sunflower oils. Avocado. Seasoning and other foods Herbs. Spices. Seasoning mixes without salt. Unsalted popcorn and pretzels. Fat-free sweets. What foods are not recommended? The items listed may not be a complete list. Talk with your dietitian about what dietary choices are best for you. Grains Baked goods made with fat, such as croissants, muffins, or some breads. Dry pasta or rice meal packs. Vegetables Creamed or fried vegetables. Vegetables in a cheese sauce. Regular canned vegetables (not low-sodium or reduced-sodium). Regular canned tomato sauce and paste (not low-sodium or reduced-sodium). Regular tomato and vegetable juice (not low-sodium or reduced-sodium). Angie Fava. Olives. Fruits Canned fruit in a light or heavy syrup. Fried fruit. Fruit in cream or butter sauce. Meat and other protein foods Fatty cuts of meat. Ribs. Fried meat. Berniece Salines. Sausage. Bologna and other processed lunch meats. Salami. Fatback. Hotdogs. Bratwurst. Salted nuts and seeds. Canned  beans with added salt. Canned or smoked fish. Whole eggs or egg yolks. Chicken or [place] with skin. Dairy Whole or 2% milk, cream, and half-and-half. Whole or full-fat cream cheese. Whole-fat or sweetened yogurt. Full-fat cheese. Nondairy creamers. Whipped toppings. Processed cheese and cheese spreads. Fats and oils Butter. Stick margarine. Lard. Shortening. Ghee. Bacon fat. Tropical oils, such as coconut, palm kernel, or palm oil. Seasoning and other foods Salted popcorn and pretzels. Onion salt, garlic salt, seasoned salt, table salt, and sea salt. Worcestershire sauce. Tartar sauce. Barbecue sauce. Teriyaki sauce. Soy sauce, including reduced-sodium. Steak sauce. Canned and packaged gravies. Fish sauce. Oyster sauce. Cocktail sauce. Horseradish that you find on the shelf. Ketchup. Mustard. Meat flavorings and tenderizers. Bouillon cubes. Hot sauce and Tabasco sauce. Premade or packaged marinades. Premade or packaged taco seasonings. Relishes. Regular salad dressings. Where to find more information:  National Heart, Lung, and [place]: https://wilson-eaton.com/  American Heart Association: www.heart.org Summary  The DASH eating plan is a healthy eating plan that has been shown to reduce high blood pressure (hypertension). It may also reduce your risk for type 2 diabetes, heart disease, and stroke.  With the DASH eating plan, you should limit salt (sodium) intake to 2,300 mg a day. If you have hypertension, you may need to reduce your sodium intake  to 1,500 mg a day.  When on the DASH eating plan, aim to eat more fresh fruits and vegetables, whole grains, lean proteins, low-fat dairy, and heart-healthy fats.  Work with your health care provider or diet and nutrition specialist (dietitian) to adjust your eating plan to your individual calorie needs. This information is not intended to replace advice given to you by your health care provider. Make sure you discuss any questions you have with your  health care provider. Document Revised: 07/24/2017 Document Reviewed: 08/04/2016 Elsevier Patient Education  2020 [employer].

## 2020-06-14 NOTE — Progress Notes (Signed)
New Patient Office Visit     This visit occurred during the SARS-CoV-2 public health emergency.  Safety protocols were in place, including screening questions prior to the visit, additional usage of staff PPE, and extensive cleaning of exam room while observing appropriate contact time as indicated for disinfecting solutions.    CC/Reason for Visit: Establish care, discuss chronic concerns Previous PCP: None Last Visit: Unknown  HPI: April Levine is a 49 y.o. female who is coming in today for the above mentioned reasons.  She has no past medical history of significance, however she has not had routine medical care in years.  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  She smokes, she drinks about 4 ounces of rum every other night, she has no known drug allergies, she has no past medical history and takes no medications.  Her past surgical history significant for 2 C-sections and a left knee arthroscopy.  She had a DVT during her pregnancy and was on anticoagulation for some time.  She has noticed occasional instances of elevated blood pressure.  She sees GYN and had a Pap smear earlier this year.  She is due for mammogram in November.  She had a colonoscopy in 2019 and was told to return in 10 years.  She is due to have her flu and Tdap vaccines updated.  She is also now eligible for Covid booster.   Past Medical/Surgical History: Past Medical History:  Diagnosis Date  . Anemia   . Anemia   . Chicken pox   . DVT (deep vein thrombosis) in pregnancy 2003    and post surgery left leg  knee in preg  . MRSA (methicillin resistant staph aureus) culture positive     Past Surgical History:  Procedure Laterality Date  . KNEE ARTHROSCOPY     had dvt post op    Social History:  reports that she has never smoked. She has never used smokeless tobacco. She reports current alcohol use. She reports that she does not use drugs.  Allergies: No Known  Allergies  Family History:  Family History  Problem Relation Age of Onset  . Alcohol abuse Other   . Arthritis Mother   . Hypertension Mother   . Pulmonary embolism Mother   . Dementia Father   . Stroke Father   . Alcohol abuse Maternal Grandmother      Current Outpatient Medications:  .  ELDERBERRY PO, Take by mouth., Disp: , Rfl:  .  ferrous sulfate 325 (65 FE) MG tablet, Take 325 mg by mouth 3 (three) times daily. (Patient not taking: Reported on 06/14/2020), Disp: , Rfl: 0  Review of Systems:  Constitutional: Denies fever, chills, diaphoresis, appetite change and fatigue.  HEENT: Denies photophobia, eye pain, redness, hearing loss, ear pain, congestion, sore throat, rhinorrhea, sneezing, mouth sores, trouble swallowing, neck pain, neck stiffness and tinnitus.   Respiratory: Denies SOB, DOE, cough, chest tightness,  and wheezing.   Cardiovascular: Denies chest pain, palpitations and leg swelling.  Gastrointestinal: Denies nausea, vomiting, abdominal pain, diarrhea, constipation, blood in stool and abdominal distention.  Genitourinary: Denies dysuria, urgency, frequency, hematuria, flank pain and difficulty urinating.  Endocrine: Denies: hot or cold intolerance, sweats, changes in hair or nails, polyuria, polydipsia. Musculoskeletal: Denies myalgias, back pain, joint swelling, arthralgias and gait problem.  Skin: Denies pallor, rash and wound.  Neurological: Denies dizziness, seizures, syncope, weakness, light-headedness, numbness and headaches.  Hematological: Denies  adenopathy. Easy bruising, personal or family bleeding history  Psychiatric/Behavioral: Denies suicidal ideation, mood changes, confusion, nervousness, sleep disturbance and agitation    Physical Exam: Vitals:   06/14/20 1141  BP: 140/90  Pulse: 67  Temp: 98.3 F (36.8 C)  TempSrc: Oral  SpO2: 99%  Weight: 196 lb 14.4 oz (89.3 kg)  Height: 5' 7.5" (1.715 m)   Body mass index is 30.38  kg/m.  Constitutional: NAD, calm, comfortable Eyes: PERRL, lids and conjunctivae normal ENMT: Mucous membranes are moist.  Respiratory: clear to auscultation bilaterally, no wheezing, no crackles. Normal respiratory effort. No accessory muscle use.  Cardiovascular: Regular rate and rhythm, no murmurs / rubs / gallops.  1+ pitting bilateral lower extremity edema.  Neurologic: Grossly intact and nonfocal Psychiatric: Normal judgment and insight. Alert and oriented x 3. Normal mood.    Impression and Plan:  Elevated BP without diagnosis of hypertension -Blood pressure is elevated to 140/90 today. -We have discussed low-sodium diet. -She will do ambulatory blood pressure monitoring and return in 6 to 8 weeks for follow-up.  Obesity (BMI 30.0-34.9) -Discussed healthy lifestyle, including increased physical activity and better food choices to promote weight loss.  Need for influenza vaccination -Flu vaccine administered today.  Need for Tdap vaccination -Tdap administered today.    Patient Instructions  -Nice seeing you today!!  -Flu and tdap vaccines today.  -Check your blood pressure 2-3 times a week at home and bring measurements into your next visit.  -Low salt diet (see below).  -Schedule follow up in 6-8 weeks. Please come in fasting for labs.   DASH Eating Plan DASH stands for "Dietary Approaches to Stop Hypertension." The DASH eating plan is a healthy eating plan that has been shown to reduce high blood pressure (hypertension). It may also reduce your risk for type 2 diabetes, heart disease, and stroke. The DASH eating plan may also help with weight loss. What are tips for following this plan?  General guidelines  Avoid eating more than 2,300 mg (milligrams) of salt (sodium) a day. If you have hypertension, you may need to reduce your sodium intake to 1,500 mg a day.  Limit alcohol intake to no more than 1 drink a day for nonpregnant women and 2 drinks a day for  men. One drink equals 12 oz of beer, 5 oz of wine, or 1 oz of hard liquor.  Work with your health care provider to maintain a healthy body weight or to lose weight. Ask what an ideal weight is for you.  Get at least 30 minutes of exercise that causes your heart to beat faster (aerobic exercise) most days of the week. Activities may include walking, swimming, or biking.  Work with your health care provider or diet and nutrition specialist (dietitian) to adjust your eating plan to your individual calorie needs. Reading food labels   Check food labels for the amount of sodium per serving. Choose foods with less than 5 percent of the Daily Value of sodium. Generally, foods with less than 300 mg of sodium per serving fit into this eating plan.  To find whole grains, look for the word "whole" as the first word in the ingredient list. Shopping  Buy products labeled as "low-sodium" or "no salt added."  Buy fresh foods. Avoid canned foods and premade or frozen meals. Cooking  Avoid adding salt when cooking. Use salt-free seasonings or herbs instead of table salt or sea salt. Check with your health care provider or pharmacist before using salt  substitutes.  Do not fry foods. Cook foods using healthy methods such as baking, boiling, grilling, and broiling instead.  Cook with heart-healthy oils, such as olive, canola, soybean, or sunflower oil. Meal planning  Eat a balanced diet that includes: ? 5 or more servings of fruits and vegetables each day. At each meal, try to fill half of your plate with fruits and vegetables. ? Up to 6-8 servings of whole grains each day. ? Less than 6 oz of lean meat, poultry, or fish each day. A 3-oz serving of meat is about the same size as a deck of cards. One egg equals 1 oz. ? 2 servings of low-fat dairy each day. ? A serving of nuts, seeds, or beans 5 times each week. ? Heart-healthy fats. Healthy fats called Omega-3 fatty acids are found in foods such as  flaxseeds and coldwater fish, like sardines, salmon, and mackerel.  Limit how much you eat of the following: ? Canned or prepackaged foods. ? Food that is high in trans fat, such as fried foods. ? Food that is high in saturated fat, such as fatty meat. ? Sweets, desserts, sugary drinks, and other foods with added sugar. ? Full-fat dairy products.  Do not salt foods before eating.  Try to eat at least 2 vegetarian meals each week.  Eat more home-cooked food and less restaurant, buffet, and fast food.  When eating at a restaurant, ask that your food be prepared with less salt or no salt, if possible. What foods are recommended? The items listed may not be a complete list. Talk with your dietitian about what dietary choices are best for you. Grains Whole-grain or whole-wheat bread. Whole-grain or whole-wheat pasta. Brown rice. Orpah Cobbatmeal. Quinoa. Bulgur. Whole-grain and low-sodium cereals. Pita bread. Low-fat, low-sodium crackers. Whole-wheat flour tortillas. Vegetables Fresh or frozen vegetables (raw, steamed, roasted, or grilled). Low-sodium or reduced-sodium tomato and vegetable juice. Low-sodium or reduced-sodium tomato sauce and tomato paste. Low-sodium or reduced-sodium canned vegetables. Fruits All fresh, dried, or frozen fruit. Canned fruit in natural juice (without added sugar). Meat and other protein foods Skinless chicken or [place]. Ground chicken or [place]. Pork with fat trimmed off. Fish and seafood. Egg whites. Dried beans, peas, or lentils. Unsalted nuts, nut butters, and seeds. Unsalted canned beans. Lean cuts of beef with fat trimmed off. Low-sodium, lean deli meat. Dairy Low-fat (1%) or fat-free (skim) milk. Fat-free, low-fat, or reduced-fat cheeses. Nonfat, low-sodium ricotta or cottage cheese. Low-fat or nonfat yogurt. Low-fat, low-sodium cheese. Fats and oils Soft margarine without trans fats. Vegetable oil. Low-fat, reduced-fat, or light mayonnaise and salad dressings  (reduced-sodium). Canola, safflower, olive, soybean, and sunflower oils. Avocado. Seasoning and other foods Herbs. Spices. Seasoning mixes without salt. Unsalted popcorn and pretzels. Fat-free sweets. What foods are not recommended? The items listed may not be a complete list. Talk with your dietitian about what dietary choices are best for you. Grains Baked goods made with fat, such as croissants, muffins, or some breads. Dry pasta or rice meal packs. Vegetables Creamed or fried vegetables. Vegetables in a cheese sauce. Regular canned vegetables (not low-sodium or reduced-sodium). Regular canned tomato sauce and paste (not low-sodium or reduced-sodium). Regular tomato and vegetable juice (not low-sodium or reduced-sodium). Rosita FirePickles. Olives. Fruits Canned fruit in a light or heavy syrup. Fried fruit. Fruit in cream or butter sauce. Meat and other protein foods Fatty cuts of meat. Ribs. Fried meat. Tomasa BlaseBacon. Sausage. Bologna and other processed lunch meats. Salami. Fatback. Hotdogs. Bratwurst. Salted nuts and seeds. Canned  beans with added salt. Canned or smoked fish. Whole eggs or egg yolks. Chicken or [place] with skin. Dairy Whole or 2% milk, cream, and half-and-half. Whole or full-fat cream cheese. Whole-fat or sweetened yogurt. Full-fat cheese. Nondairy creamers. Whipped toppings. Processed cheese and cheese spreads. Fats and oils Butter. Stick margarine. Lard. Shortening. Ghee. Bacon fat. Tropical oils, such as coconut, palm kernel, or palm oil. Seasoning and other foods Salted popcorn and pretzels. Onion salt, garlic salt, seasoned salt, table salt, and sea salt. Worcestershire sauce. Tartar sauce. Barbecue sauce. Teriyaki sauce. Soy sauce, including reduced-sodium. Steak sauce. Canned and packaged gravies. Fish sauce. Oyster sauce. Cocktail sauce. Horseradish that you find on the shelf. Ketchup. Mustard. Meat flavorings and tenderizers. Bouillon cubes. Hot sauce and Tabasco sauce. Premade or  packaged marinades. Premade or packaged taco seasonings. Relishes. Regular salad dressings. Where to find more information:  National Heart, Lung, and Blood Institute: PopSteam.is  American Heart Association: www.heart.org Summary  The DASH eating plan is a healthy eating plan that has been shown to reduce high blood pressure (hypertension). It may also reduce your risk for type 2 diabetes, heart disease, and stroke.  With the DASH eating plan, you should limit salt (sodium) intake to 2,300 mg a day. If you have hypertension, you may need to reduce your sodium intake to 1,500 mg a day.  When on the DASH eating plan, aim to eat more fresh fruits and vegetables, whole grains, lean proteins, low-fat dairy, and heart-healthy fats.  Work with your health care provider or diet and nutrition specialist (dietitian) to adjust your eating plan to your individual calorie needs. This information is not intended to replace advice given to you by your health care provider. Make sure you discuss any questions you have with your health care provider. Document Revised: 07/24/2017 Document Reviewed: 08/04/2016 Elsevier Patient Education  2020 Elsevier Inc.      Chaya Jan, MD [facility]

## 2020-06-14 NOTE — Addendum Note (Signed)
Addended by: Kern Reap B on: 06/14/2020 04:30 PM   Modules accepted: Orders

## 2020-06-26 DIAGNOSIS — Z113 Encounter for screening for infections with a predominantly sexual mode of transmission: Secondary | ICD-10-CM | POA: Diagnosis not present

## 2020-06-26 DIAGNOSIS — Z1231 Encounter for screening mammogram for malignant neoplasm of breast: Secondary | ICD-10-CM | POA: Diagnosis not present

## 2020-06-26 DIAGNOSIS — Z01419 Encounter for gynecological examination (general) (routine) without abnormal findings: Secondary | ICD-10-CM | POA: Diagnosis not present

## 2020-06-26 DIAGNOSIS — Z6830 Body mass index (BMI) 30.0-30.9, adult: Secondary | ICD-10-CM | POA: Diagnosis not present

## 2020-07-25 ENCOUNTER — Encounter: Payer: Self-pay | Admitting: Internal Medicine

## 2020-07-25 ENCOUNTER — Ambulatory Visit: Payer: BC Managed Care – PPO | Admitting: Internal Medicine

## 2020-07-25 ENCOUNTER — Other Ambulatory Visit: Payer: Self-pay

## 2020-07-25 VITALS — BP 118/76 | HR 82 | Temp 98.5°F | Ht 67.5 in | Wt 200.0 lb

## 2020-07-25 DIAGNOSIS — B029 Zoster without complications: Secondary | ICD-10-CM | POA: Diagnosis not present

## 2020-07-25 DIAGNOSIS — R21 Rash and other nonspecific skin eruption: Secondary | ICD-10-CM

## 2020-07-25 MED ORDER — VALACYCLOVIR HCL 1 G PO TABS: 1000.0000 mg | ORAL_TABLET | Freq: Three times a day (TID) | ORAL | 0 refills | Status: DC

## 2020-07-25 NOTE — Progress Notes (Signed)
Chief Complaint  Patient presents with   Rash    left breast, upper back painful and itchy began 2 to 3 days ago    HPI: April Levine 49 y.o. come in for SDA PCP appointment NA Onset 2 to 3 days of blotchy pink bumpy rash left breast and left upper back on the left.  She started with scratching and now it is painful on the back but not on the left breast. No exposures does not know her past history of chickenpox No fever or other systemic symptoms.  ROS: See pertinent positives and negatives per HPI.  Past Medical History:  Diagnosis Date   Anemia    Anemia    Chicken pox    DVT (deep vein thrombosis) in pregnancy 2003    and post surgery left leg  knee in preg   MRSA (methicillin resistant staph aureus) culture positive     Family History  Problem Relation Age of Onset   Alcohol abuse Other    Arthritis Mother    Hypertension Mother    Pulmonary embolism Mother    Dementia Father    Stroke Father    Alcohol abuse Maternal Grandmother     Social History   Socioeconomic History   Marital status: Single    Spouse name: Not on file   Number of children: Not on file   Years of education: Not on file   Highest education level: Not on file  Occupational History   Not on file  Tobacco Use   Smoking status: Never Smoker   Smokeless tobacco: Never Used  Substance and Sexual Activity   Alcohol use: Yes    Alcohol/week: 0.0 standard drinks    Comment: 4 oz rum every other night   Drug use: No   Sexual activity: Yes    Birth control/protection: I.U.D.    Comment: paraguard  Other Topics Concern   Not on file  Social History Narrative   Regular exercise-No   7-8 hours of sleep per night   3 people in the home  18 and 11    Has 1 dog.   Poly sci  Degree and and MBA    From [employer]  9-5    On call ups tracking    caffiene ocass    etoh    6 preg 2 Live births            Social Determinants of Health    Financial Resource Strain:    Difficulty of Paying Living Expenses: Not on file  Food Insecurity:    Worried About Programme researcher, broadcasting/film/video in the Last Year: Not on file   [employer] of Food in the Last Year: Not on file  Transportation Needs:    Lack of Transportation (Medical): Not on file   Lack of Transportation (Non-Medical): Not on file  Physical Activity:    Days of Exercise per Week: Not on file   Minutes of Exercise per Session: Not on file  Stress:    Feeling of Stress : Not on file  Social Connections:    Frequency of Communication with Friends and Family: Not on file   Frequency of Social Gatherings with Friends and Family: Not on file   Attends Religious Services: Not on file   Active Member of Clubs or Organizations: Not on file   Attends Banker Meetings: Not on file   Marital Status: Not on file  Outpatient Medications Prior to Visit  Medication Sig Dispense Refill   ELDERBERRY PO Take by mouth.     ferrous sulfate 325 (65 FE) MG tablet Take 325 mg by mouth 3 (three) times daily. (Patient not taking: Reported on 06/14/2020)  0   No facility-administered medications prior to visit.     EXAM:  BP 118/76    Pulse 82    Temp 98.5 F (36.9 C) (Oral)    Ht 5' 7.5" (1.715 m)    Wt 200 lb (90.7 kg)    SpO2 99%    BMI 30.86 kg/m   Body mass index is 30.86 kg/m.  GENERAL: vitals reviewed and listed above, alert, oriented, appears well hydrated and in no acute distress HEENT: atraumatic, conjunctiva  clear, no obvious abnormalities on inspection of external nose and ears OP : Masked NECK: no obvious masses on inspection palpation  No respiratory distress Skin blotchy crop of pink-red bumps left chest breast area on back 3 discrete bumps that are not boils or abscess all are left of midline or midline.  Patient states back hurts with the situation.  No adenopathy. CV: HRRR, no clubbing cyanosis or  peripheral edema nl cap refill  MS: moves  all extremities without noticeable focal  abnormality PSYCH: pleasant and cooperative, no obvious depression or anxiety Lab Results  Component Value Date   WBC 4.3 05/27/2019   HGB 11.1 (L) 05/27/2019   HCT 35.0 (L) 05/27/2019   PLT 212.0 05/27/2019   GLUCOSE 89 05/27/2019   CHOL 181 05/27/2019   TRIG 73.0 05/27/2019   HDL 54.90 05/27/2019   LDLCALC 111 (H) 05/27/2019   ALT 6 05/27/2019   AST 10 05/27/2019   NA 140 05/27/2019   K 4.1 05/27/2019   CL 106 05/27/2019   CREATININE 0.69 05/27/2019   BUN 13 05/27/2019   CO2 27 05/27/2019   TSH 0.71 05/27/2019   HGBA1C 5.9 04/14/2016   BP Readings from Last 3 Encounters:  07/25/20 118/76  06/14/20 140/90  05/27/19 120/66    ASSESSMENT AND PLAN:  Discussed the following assessment and plan:  Herpes zoster without complication probable  Rash Strongly suspicious of early shingles zoster. Does not look like bacterial skin infection at this time and with the level of pain in the minimalist look of rash. Empiric treatment for shingles expectant management she already has a follow-up with PCP next week who can follow-up Counseled about transmission communicability. -Patient advised to return or notify health care team  if  new concerns arise.  Patient Instructions  This acts like early shingles .  Begin medication    advil aleve ttylneol for pain right now .  Fu with pcp if needed      Shingles  Shingles, which is also known as herpes zoster, is an infection that causes a painful skin rash and fluid-filled blisters. It is caused by a virus. Shingles only develops in people who:  Have had chickenpox.  Have been given a medicine to protect against chickenpox (have been vaccinated). Shingles is rare in this group. What are the causes? Shingles is caused by varicella-zoster virus (VZV). This is the same virus that causes chickenpox. After a person is exposed to VZV, the virus stays in the body in an inactive (dormant)  state. Shingles develops if the virus is reactivated. This can happen many years after the first (initial) exposure to VZV. It is not known what causes this virus to be reactivated. What increases the risk? People who  have had chickenpox or received the chickenpox vaccine are at risk for shingles. Shingles infection is more common in people who:  Are older than age 36.  Have a weakened disease-fighting system (immune system), such as people with: ? HIV. ? AIDS. ? Cancer.  Are taking medicines that weaken the immune system, such as transplant medicines.  Are experiencing a lot of stress. What are the signs or symptoms? Early symptoms of this condition include itching, tingling, and pain in an area on your skin. Pain may be described as burning, stabbing, or throbbing. A few days or weeks after early symptoms start, a painful red rash appears. The rash is usually on one side of the body and has a band-like or belt-like pattern. The rash eventually turns into fluid-filled blisters that break open, change into scabs, and dry up in about 2-3 weeks. At any time during the infection, you may also develop:  A fever.  Chills.  A headache.  An upset stomach. How is this diagnosed? This condition is diagnosed with a skin exam. Skin or fluid samples may be taken from the blisters before a diagnosis is made. These samples are examined under a microscope or sent to a lab for testing. How is this treated? The rash may last for several weeks. There is not a specific cure for this condition. Your health care provider will probably prescribe medicines to help you manage pain, recover more quickly, and avoid long-term problems. Medicines may include:  Antiviral drugs.  Anti-inflammatory drugs.  Pain medicines.  Anti-itching medicines (antihistamines). If the area involved is on your face, you may be referred to a specialist, such as an eye doctor (ophthalmologist) or an ear, nose, and throat (ENT)  doctor (otolaryngologist) to help you avoid eye problems, chronic pain, or disability. Follow these instructions at home: Medicines  Take over-the-counter and prescription medicines only as told by your health care provider.  Apply an anti-itch cream or numbing cream to the affected area as told by your health care provider. Relieving itching and discomfort   Apply cold, wet cloths (cold compresses) to the area of the rash or blisters as told by your health care provider.  Cool baths can be soothing. Try adding baking soda or dry oatmeal to the water to reduce itching. Do not bathe in hot water. Blister and rash care  Keep your rash covered with a loose bandage (dressing). Wear loose-fitting clothing to help ease the pain of material rubbing against the rash.  Keep your rash and blisters clean by washing the area with mild soap and cool water as told by your health care provider.  Check your rash every day for signs of infection. Check for: ? More redness, swelling, or pain. ? Fluid or blood. ? Warmth. ? Pus or a bad smell.  Do not scratch your rash or pick at your blisters. To help avoid scratching: ? Keep your fingernails clean and cut short. ? Wear gloves or mittens while you sleep, if scratching is a problem. General instructions  Rest as told by your health care provider.  Keep all follow-up visits as told by your health care provider. This is important.  Wash your hands often with soap and water. If soap and water are not available, use hand sanitizer. Doing this lowers your chance of getting a bacterial skin infection.  Before your blisters change into scabs, your shingles infection can cause chickenpox in people who have never had it or have never been vaccinated against it. To  prevent this from happening, avoid contact with other people, especially: ? Babies. ? Pregnant women. ? Children who have eczema. ? Elderly people who have transplants. ? People who have  chronic illnesses, such as cancer or AIDS. Contact a health care provider if:  Your pain is not relieved with prescribed medicines.  Your pain does not get better after the rash heals.  You have signs of infection in the rash area, such as: ? More redness, swelling, or pain around the rash. ? Fluid or blood coming from the rash. ? The rash area feeling warm to the touch. ? Pus or a bad smell coming from the rash. Get help right away if:  The rash is on your face or nose.  You have facial pain, pain around your eye area, or loss of feeling on one side of your face.  You have difficulty seeing.  You have ear pain or have ringing in your ear.  You have a loss of taste.  Your condition gets worse. Summary  Shingles, which is also known as herpes zoster, is an infection that causes a painful skin rash and fluid-filled blisters.  This condition is diagnosed with a skin exam. Skin or fluid samples may be taken from the blisters and examined before the diagnosis is made.  Keep your rash covered with a loose bandage (dressing). Wear loose-fitting clothing to help ease the pain of material rubbing against the rash.  Before your blisters change into scabs, your shingles infection can cause chickenpox in people who have never had it or have never been vaccinated against it. This information is not intended to replace advice given to you by your health care provider. Make sure you discuss any questions you have with your health care provider. Document Revised: 12/03/2018 Document Reviewed: 04/15/2017 Elsevier Patient Education  2020 [employer]     Van LearWanda K. Salik Grewell M.D.

## 2020-07-25 NOTE — Patient Instructions (Signed)
This acts like early shingles .  Begin medication    advil aleve ttylneol for pain right now .  Fu with pcp if needed      Shingles  Shingles, which is also known as herpes zoster, is an infection that causes a painful skin rash and fluid-filled blisters. It is caused by a virus. Shingles only develops in people who:  Have had chickenpox.  Have been given a medicine to protect against chickenpox (have been vaccinated). Shingles is rare in this group. What are the causes? Shingles is caused by varicella-zoster virus (VZV). This is the same virus that causes chickenpox. After a person is exposed to VZV, the virus stays in the body in an inactive (dormant) state. Shingles develops if the virus is reactivated. This can happen many years after the first (initial) exposure to VZV. It is not known what causes this virus to be reactivated. What increases the risk? People who have had chickenpox or received the chickenpox vaccine are at risk for shingles. Shingles infection is more common in people who:  Are older than age 28.  Have a weakened disease-fighting system (immune system), such as people with: ? HIV. ? AIDS. ? Cancer.  Are taking medicines that weaken the immune system, such as transplant medicines.  Are experiencing a lot of stress. What are the signs or symptoms? Early symptoms of this condition include itching, tingling, and pain in an area on your skin. Pain may be described as burning, stabbing, or throbbing. A few days or weeks after early symptoms start, a painful red rash appears. The rash is usually on one side of the body and has a band-like or belt-like pattern. The rash eventually turns into fluid-filled blisters that break open, change into scabs, and dry up in about 2-3 weeks. At any time during the infection, you may also develop:  A fever.  Chills.  A headache.  An upset stomach. How is this diagnosed? This condition is diagnosed with a skin exam. Skin  or fluid samples may be taken from the blisters before a diagnosis is made. These samples are examined under a microscope or sent to a lab for testing. How is this treated? The rash may last for several weeks. There is not a specific cure for this condition. Your health care provider will probably prescribe medicines to help you manage pain, recover more quickly, and avoid long-term problems. Medicines may include:  Antiviral drugs.  Anti-inflammatory drugs.  Pain medicines.  Anti-itching medicines (antihistamines). If the area involved is on your face, you may be referred to a specialist, such as an eye doctor (ophthalmologist) or an ear, nose, and throat (ENT) doctor (otolaryngologist) to help you avoid eye problems, chronic pain, or disability. Follow these instructions at home: Medicines  Take over-the-counter and prescription medicines only as told by your health care provider.  Apply an anti-itch cream or numbing cream to the affected area as told by your health care provider. Relieving itching and discomfort   Apply cold, wet cloths (cold compresses) to the area of the rash or blisters as told by your health care provider.  Cool baths can be soothing. Try adding baking soda or dry oatmeal to the water to reduce itching. Do not bathe in hot water. Blister and rash care  Keep your rash covered with a loose bandage (dressing). Wear loose-fitting clothing to help ease the pain of material rubbing against the rash.  Keep your rash and blisters clean by washing the area with mild  soap and cool water as told by your health care provider.  Check your rash every day for signs of infection. Check for: ? More redness, swelling, or pain. ? Fluid or blood. ? Warmth. ? Pus or a bad smell.  Do not scratch your rash or pick at your blisters. To help avoid scratching: ? Keep your fingernails clean and cut short. ? Wear gloves or mittens while you sleep, if scratching is a  problem. General instructions  Rest as told by your health care provider.  Keep all follow-up visits as told by your health care provider. This is important.  Wash your hands often with soap and water. If soap and water are not available, use hand sanitizer. Doing this lowers your chance of getting a bacterial skin infection.  Before your blisters change into scabs, your shingles infection can cause chickenpox in people who have never had it or have never been vaccinated against it. To prevent this from happening, avoid contact with other people, especially: ? Babies. ? Pregnant women. ? Children who have eczema. ? Elderly people who have transplants. ? People who have chronic illnesses, such as cancer or AIDS. Contact a health care provider if:  Your pain is not relieved with prescribed medicines.  Your pain does not get better after the rash heals.  You have signs of infection in the rash area, such as: ? More redness, swelling, or pain around the rash. ? Fluid or blood coming from the rash. ? The rash area feeling warm to the touch. ? Pus or a bad smell coming from the rash. Get help right away if:  The rash is on your face or nose.  You have facial pain, pain around your eye area, or loss of feeling on one side of your face.  You have difficulty seeing.  You have ear pain or have ringing in your ear.  You have a loss of taste.  Your condition gets worse. Summary  Shingles, which is also known as herpes zoster, is an infection that causes a painful skin rash and fluid-filled blisters.  This condition is diagnosed with a skin exam. Skin or fluid samples may be taken from the blisters and examined before the diagnosis is made.  Keep your rash covered with a loose bandage (dressing). Wear loose-fitting clothing to help ease the pain of material rubbing against the rash.  Before your blisters change into scabs, your shingles infection can cause chickenpox in people who  have never had it or have never been vaccinated against it. This information is not intended to replace advice given to you by your health care provider. Make sure you discuss any questions you have with your health care provider. Document Revised: 12/03/2018 Document Reviewed: 04/15/2017 Elsevier Patient Education  2020 [employer].

## 2020-08-01 ENCOUNTER — Ambulatory Visit: Payer: BC Managed Care – PPO | Admitting: Internal Medicine

## 2020-08-01 ENCOUNTER — Other Ambulatory Visit: Payer: Self-pay

## 2020-08-10 ENCOUNTER — Ambulatory Visit: Payer: BC Managed Care – PPO | Admitting: Internal Medicine

## 2020-08-10 ENCOUNTER — Encounter: Payer: Self-pay | Admitting: Internal Medicine

## 2020-08-10 ENCOUNTER — Other Ambulatory Visit: Payer: Self-pay

## 2020-08-10 ENCOUNTER — Other Ambulatory Visit: Payer: Self-pay | Admitting: Internal Medicine

## 2020-08-10 ENCOUNTER — Other Ambulatory Visit (INDEPENDENT_AMBULATORY_CARE_PROVIDER_SITE_OTHER): Payer: BC Managed Care – PPO

## 2020-08-10 VITALS — BP 110/80 | HR 74 | Temp 98.2°F | Wt 200.6 lb

## 2020-08-10 DIAGNOSIS — Z0000 Encounter for general adult medical examination without abnormal findings: Secondary | ICD-10-CM

## 2020-08-10 DIAGNOSIS — R030 Elevated blood-pressure reading, without diagnosis of hypertension: Secondary | ICD-10-CM

## 2020-08-10 DIAGNOSIS — E559 Vitamin D deficiency, unspecified: Secondary | ICD-10-CM

## 2020-08-10 DIAGNOSIS — D509 Iron deficiency anemia, unspecified: Secondary | ICD-10-CM

## 2020-08-10 DIAGNOSIS — E785 Hyperlipidemia, unspecified: Secondary | ICD-10-CM | POA: Insufficient documentation

## 2020-08-10 LAB — HEMOGLOBIN A1C: Hgb A1c MFr Bld: 5.8 % (ref 4.6–6.5)

## 2020-08-10 LAB — CBC WITH DIFFERENTIAL/PLATELET
Basophils Absolute: 0.1 10*3/uL (ref 0.0–0.1)
Basophils Relative: 1.4 % (ref 0.0–3.0)
Eosinophils Absolute: 0.1 10*3/uL (ref 0.0–0.7)
Eosinophils Relative: 3.6 % (ref 0.0–5.0)
HCT: 32.7 % — ABNORMAL LOW (ref 36.0–46.0)
Hemoglobin: 10.2 g/dL — ABNORMAL LOW (ref 12.0–15.0)
Lymphocytes Relative: 41.8 % (ref 12.0–46.0)
Lymphs Abs: 1.7 10*3/uL (ref 0.7–4.0)
MCHC: 31.2 g/dL (ref 30.0–36.0)
MCV: 83.4 fl (ref 78.0–100.0)
Monocytes Absolute: 0.4 10*3/uL (ref 0.1–1.0)
Monocytes Relative: 9.3 % (ref 3.0–12.0)
Neutro Abs: 1.8 10*3/uL (ref 1.4–7.7)
Neutrophils Relative %: 43.9 % (ref 43.0–77.0)
Platelets: 304 10*3/uL (ref 150.0–400.0)
RBC: 3.92 Mil/uL (ref 3.87–5.11)
RDW: 16.4 % — ABNORMAL HIGH (ref 11.5–15.5)
WBC: 4.1 10*3/uL (ref 4.0–10.5)

## 2020-08-10 LAB — COMPREHENSIVE METABOLIC PANEL
ALT: 7 U/L (ref 0–35)
AST: 13 U/L (ref 0–37)
Albumin: 4.2 g/dL (ref 3.5–5.2)
Alkaline Phosphatase: 85 U/L (ref 39–117)
BUN: 11 mg/dL (ref 6–23)
CO2: 26 mEq/L (ref 19–32)
Calcium: 8.9 mg/dL (ref 8.4–10.5)
Chloride: 104 mEq/L (ref 96–112)
Creatinine, Ser: 0.75 mg/dL (ref 0.40–1.20)
GFR: 93.64 mL/min (ref >60.00)
Glucose, Bld: 84 mg/dL (ref 70–99)
Potassium: 3.8 mEq/L (ref 3.5–5.1)
Sodium: 136 mEq/L (ref 135–145)
Total Bilirubin: 0.5 mg/dL (ref 0.2–1.2)
Total Protein: 7.8 g/dL (ref 6.0–8.3)

## 2020-08-10 LAB — LIPID PANEL
Cholesterol: 200 mg/dL (ref 0–200)
HDL: 72.6 mg/dL (ref >39.00)
LDL Cholesterol: 112 mg/dL — ABNORMAL HIGH (ref 0–99)
NonHDL: 127.57
Total CHOL/HDL Ratio: 3
Triglycerides: 76 mg/dL (ref 0.0–149.0)
VLDL: 15.2 mg/dL (ref 0.0–40.0)

## 2020-08-10 LAB — VITAMIN B12: Vitamin B-12: 368 pg/mL (ref 211–911)

## 2020-08-10 LAB — VITAMIN D 25 HYDROXY (VIT D DEFICIENCY, FRACTURES): VITD: 16.11 ng/mL — ABNORMAL LOW (ref 30.00–100.00)

## 2020-08-10 LAB — TSH: TSH: 1.47 u[IU]/mL (ref 0.35–4.50)

## 2020-08-10 MED ORDER — VITAMIN D (ERGOCALCIFEROL) 1.25 MG (50000 UNIT) PO CAPS: 50000.0000 [IU] | ORAL_CAPSULE | ORAL | 0 refills | Status: DC

## 2020-08-10 NOTE — Addendum Note (Signed)
Addended by: Leonette Nutting on: 08/10/2020 11:12 AM   Modules accepted: Orders

## 2020-08-10 NOTE — Patient Instructions (Signed)
-Nice seeing you today!!  -Lab work today; will notify you once results are available.  -Remember to get your COVID booster.  -Schedule follow up in 1 year or sooner as needed.   Preventive Care 61-49 Years Old, Female Preventive care refers to visits with your health care provider and lifestyle choices that can promote health and wellness. This includes:  A yearly physical exam. This may also be called an annual well check.  Regular dental visits and eye exams.  Immunizations.  Screening for certain conditions.  Healthy lifestyle choices, such as eating a healthy diet, getting regular exercise, not using drugs or products that contain nicotine and tobacco, and limiting alcohol use. What can I expect for my preventive care visit? Physical exam Your health care provider will check your:  Height and weight. This may be used to calculate body mass index (BMI), which tells if you are at a healthy weight.  Heart rate and blood pressure.  Skin for abnormal spots. Counseling Your health care provider may ask you questions about your:  Alcohol, tobacco, and drug use.  Emotional well-being.  Home and relationship well-being.  Sexual activity.  Eating habits.  Work and work Statistician.  Method of birth control.  Menstrual cycle.  Pregnancy history. What immunizations do I need?  Influenza (flu) vaccine  This is recommended every year. Tetanus, diphtheria, and pertussis (Tdap) vaccine  You may need a Td booster every 10 years. Varicella (chickenpox) vaccine  You may need this if you have not been vaccinated. Zoster (shingles) vaccine  You may need this after age 43. Measles, mumps, and rubella (MMR) vaccine  You may need at least one dose of MMR if you were born in 1957 or later. You may also need a second dose. Pneumococcal conjugate (PCV13) vaccine  You may need this if you have certain conditions and were not previously vaccinated. Pneumococcal  polysaccharide (PPSV23) vaccine  You may need one or two doses if you smoke cigarettes or if you have certain conditions. Meningococcal conjugate (MenACWY) vaccine  You may need this if you have certain conditions. Hepatitis A vaccine  You may need this if you have certain conditions or if you travel or work in places where you may be exposed to hepatitis A. Hepatitis B vaccine  You may need this if you have certain conditions or if you travel or work in places where you may be exposed to hepatitis B. Haemophilus influenzae type b (Hib) vaccine  You may need this if you have certain conditions. Human papillomavirus (HPV) vaccine  If recommended by your health care provider, you may need three doses over 6 months. You may receive vaccines as individual doses or as more than one vaccine together in one shot (combination vaccines). Talk with your health care provider about the risks and benefits of combination vaccines. What tests do I need? Blood tests  Lipid and cholesterol levels. These may be checked every 5 years, or more frequently if you are over 28 years old.  Hepatitis C test.  Hepatitis B test. Screening  Lung cancer screening. You may have this screening every year starting at age 34 if you have a 30-pack-year history of smoking and currently smoke or have quit within the past 15 years.  Colorectal cancer screening. All adults should have this screening starting at age 80 and continuing until age 20. Your health care provider may recommend screening at age 88 if you are at increased risk. You will have tests every 1-10 years,  depending on your results and the type of screening test.  Diabetes screening. This is done by checking your blood sugar (glucose) after you have not eaten for a while (fasting). You may have this done every 1-3 years.  Mammogram. This may be done every 1-2 years. Talk with your health care provider about when you should start having regular  mammograms. This may depend on whether you have a family history of breast cancer.  BRCA-related cancer screening. This may be done if you have a family history of breast, ovarian, tubal, or peritoneal cancers.  Pelvic exam and Pap test. This may be done every 3 years starting at age 57. Starting at age 9, this may be done every 5 years if you have a Pap test in combination with an HPV test. Other tests  Sexually transmitted disease (STD) testing.  Bone density scan. This is done to screen for osteoporosis. You may have this scan if you are at high risk for osteoporosis. Follow these instructions at home: Eating and drinking  Eat a diet that includes fresh fruits and vegetables, whole grains, lean protein, and low-fat dairy.  Take vitamin and mineral supplements as recommended by your health care provider.  Do not drink alcohol if: ? Your health care provider tells you not to drink. ? You are pregnant, may be pregnant, or are planning to become pregnant.  If you drink alcohol: ? Limit how much you have to 0-1 drink a day. ? Be aware of how much alcohol is in your drink. In the [place] one drink equals one 12 oz bottle of beer (355 mL), one 5 oz glass of wine (148 mL), or one 1 oz glass of hard liquor (44 mL). Lifestyle  Take daily care of your teeth and gums.  Stay active. Exercise for at least 30 minutes on 5 or more days each week.  Do not use any products that contain nicotine or tobacco, such as cigarettes, e-cigarettes, and chewing tobacco. If you need help quitting, ask your health care provider.  If you are sexually active, practice safe sex. Use a condom or other form of birth control (contraception) in order to prevent pregnancy and STIs (sexually transmitted infections).  If told by your health care provider, take low-dose aspirin daily starting at age 33. What's next?  Visit your health care provider once a year for a well check visit.  Ask your health care provider  how often you should have your eyes and teeth checked.  Stay up to date on all vaccines. This information is not intended to replace advice given to you by your health care provider. Make sure you discuss any questions you have with your health care provider. Document Revised: 04/22/2018 Document Reviewed: 04/22/2018 Elsevier Patient Education  2020 [employer].

## 2020-08-10 NOTE — Progress Notes (Signed)
Established Patient Office Visit     This visit occurred during the SARS-CoV-2 public health emergency.  Safety protocols were in place, including screening questions prior to the visit, additional usage of staff PPE, and extensive cleaning of exam room while observing appropriate contact time as indicated for disinfecting solutions.    CC/Reason for Visit: Annual preventive exam, follow-up blood pressure  HPI: April Levine is a 49 y.o. female who is coming in today for the above mentioned reasons.  She has no past medical history of significance.  At her initial visit in November she was found to have elevated blood pressure and was asked to return today for follow-up.  In the interim she developed a rash, came to see another provider and was diagnosed with shingles.  She was prescribed Valtrex which she has completed.  She states the rash has resolved.  She has routine eye and dental care.  She has a GYN.  She tells me her Pap smear is not due till next year, she had a mammogram earlier this year.  She states she had a colonoscopy in 2019 and was told she is a 10-year callback although I do not have report of this.  She is now eligible for Covid booster, her flu and Tdap vaccines have been updated.   Past Medical/Surgical History: Past Medical History:  Diagnosis Date  . Anemia   . Anemia   . Chicken pox   . DVT (deep vein thrombosis) in pregnancy 2003    and post surgery left leg  knee in preg  . MRSA (methicillin resistant staph aureus) culture positive     Past Surgical History:  Procedure Laterality Date  . KNEE ARTHROSCOPY     had dvt post op    Social History:  reports that she has never smoked. She has never used smokeless tobacco. She reports current alcohol use. She reports that she does not use drugs.  Allergies: No Known Allergies  Family History:  Family History  Problem Relation Age of Onset  . Alcohol abuse Other   . Arthritis Mother   . Hypertension  Mother   . Pulmonary embolism Mother   . Dementia Father   . Stroke Father   . Alcohol abuse Maternal Grandmother      Current Outpatient Medications:  .  ELDERBERRY PO, Take by mouth., Disp: , Rfl:  .  ferrous sulfate 325 (65 FE) MG tablet, Take 325 mg by mouth 3 (three) times daily. (Patient not taking: No sig reported), Disp: , Rfl: 0 .  valACYclovir (VALTREX) 1000 MG tablet, Take 1 tablet (1,000 mg total) by mouth 3 (three) times daily. (Patient not taking: Reported on 08/10/2020), Disp: 21 tablet, Rfl: 0  Review of Systems:  Constitutional: Denies fever, chills, diaphoresis, appetite change and fatigue.  HEENT: Denies photophobia, eye pain, redness, hearing loss, ear pain, congestion, sore throat, rhinorrhea, sneezing, mouth sores, trouble swallowing, neck pain, neck stiffness and tinnitus.   Respiratory: Denies SOB, DOE, cough, chest tightness,  and wheezing.   Cardiovascular: Denies chest pain, palpitations and leg swelling.  Gastrointestinal: Denies nausea, vomiting, abdominal pain, diarrhea, constipation, blood in stool and abdominal distention.  Genitourinary: Denies dysuria, urgency, frequency, hematuria, flank pain and difficulty urinating.  Endocrine: Denies: hot or cold intolerance, sweats, changes in hair or nails, polyuria, polydipsia. Musculoskeletal: Denies myalgias, back pain, joint swelling, arthralgias and gait problem.  Skin: Denies pallor, rash and wound.  Neurological: Denies dizziness, seizures, syncope, weakness, light-headedness, numbness and  headaches.  Hematological: Denies adenopathy. Easy bruising, personal or family bleeding history  Psychiatric/Behavioral: Denies suicidal ideation, mood changes, confusion, nervousness, sleep disturbance and agitation    Physical Exam: Vitals:   08/10/20 0955  BP: 110/80  Pulse: 74  Temp: 98.2 F (36.8 C)  TempSrc: Oral  SpO2: 99%  Weight: 200 lb 9.6 oz (91 kg)    Body mass index is 30.95  kg/m.   Constitutional: NAD, calm, comfortable Eyes: PERRL, lids and conjunctivae normal ENMT: Mucous membranes are moist.  Respiratory: clear to auscultation bilaterally, no wheezing, no crackles. Normal respiratory effort. No accessory muscle use.  Cardiovascular: Regular rate and rhythm, no murmurs / rubs / gallops. No extremity edema.   Abdomen: no tenderness, no masses palpated. No hepatosplenomegaly. Bowel sounds positive.  Musculoskeletal: no clubbing / cyanosis. No joint deformity upper and lower extremities. Good ROM, no contractures. Normal muscle tone.  Skin: no rashes, lesions, ulcers. No induration Neurologic: CN 2-12 grossly intact. Sensation intact, DTR normal. Strength 5/5 in all 4.  Psychiatric: Normal judgment and insight. Alert and oriented x 3. Normal mood.    Impression and Plan:  Encounter for preventive health examination  -Advised routine eye and dental care. -She is now eligible for Covid booster, otherwise immunizations are up-to-date and age-appropriate. -Screening labs today. -Healthy lifestyle discussed in detail. -She sees a GYN who updated her mammogram this year, was told her Pap smear is due in 2022. -She had a colonoscopy in 2019 and is a 10-year callback.  Elevated BP without diagnosis of hypertension -Blood pressures well within range of normal today.  Not on medications.   Patient Instructions  -Nice seeing you today!!  -Lab work today; will notify you once results are available.  -Remember to get your COVID booster.  -Schedule follow up in 1 year or sooner as needed.   Preventive Care 34-71 Years Old, Female Preventive care refers to visits with your health care provider and lifestyle choices that can promote health and wellness. This includes:  A yearly physical exam. This may also be called an annual well check.  Regular dental visits and eye exams.  Immunizations.  Screening for certain conditions.  Healthy lifestyle choices,  such as eating a healthy diet, getting regular exercise, not using drugs or products that contain nicotine and tobacco, and limiting alcohol use. What can I expect for my preventive care visit? Physical exam Your health care provider will check your:  Height and weight. This may be used to calculate body mass index (BMI), which tells if you are at a healthy weight.  Heart rate and blood pressure.  Skin for abnormal spots. Counseling Your health care provider may ask you questions about your:  Alcohol, tobacco, and drug use.  Emotional well-being.  Home and relationship well-being.  Sexual activity.  Eating habits.  Work and work Statistician.  Method of birth control.  Menstrual cycle.  Pregnancy history. What immunizations do I need?  Influenza (flu) vaccine  This is recommended every year. Tetanus, diphtheria, and pertussis (Tdap) vaccine  You may need a Td booster every 10 years. Varicella (chickenpox) vaccine  You may need this if you have not been vaccinated. Zoster (shingles) vaccine  You may need this after age 51. Measles, mumps, and rubella (MMR) vaccine  You may need at least one dose of MMR if you were born in 1957 or later. You may also need a second dose. Pneumococcal conjugate (PCV13) vaccine  You may need this if you have certain conditions  and were not previously vaccinated. Pneumococcal polysaccharide (PPSV23) vaccine  You may need one or two doses if you smoke cigarettes or if you have certain conditions. Meningococcal conjugate (MenACWY) vaccine  You may need this if you have certain conditions. Hepatitis A vaccine  You may need this if you have certain conditions or if you travel or work in places where you may be exposed to hepatitis A. Hepatitis B vaccine  You may need this if you have certain conditions or if you travel or work in places where you may be exposed to hepatitis B. Haemophilus influenzae type b (Hib) vaccine  You may  need this if you have certain conditions. Human papillomavirus (HPV) vaccine  If recommended by your health care provider, you may need three doses over 6 months. You may receive vaccines as individual doses or as more than one vaccine together in one shot (combination vaccines). Talk with your health care provider about the risks and benefits of combination vaccines. What tests do I need? Blood tests  Lipid and cholesterol levels. These may be checked every 5 years, or more frequently if you are over 1 years old.  Hepatitis C test.  Hepatitis B test. Screening  Lung cancer screening. You may have this screening every year starting at age 39 if you have a 30-pack-year history of smoking and currently smoke or have quit within the past 15 years.  Colorectal cancer screening. All adults should have this screening starting at age 50 and continuing until age 64. Your health care provider may recommend screening at age 78 if you are at increased risk. You will have tests every 1-10 years, depending on your results and the type of screening test.  Diabetes screening. This is done by checking your blood sugar (glucose) after you have not eaten for a while (fasting). You may have this done every 1-3 years.  Mammogram. This may be done every 1-2 years. Talk with your health care provider about when you should start having regular mammograms. This may depend on whether you have a family history of breast cancer.  BRCA-related cancer screening. This may be done if you have a family history of breast, ovarian, tubal, or peritoneal cancers.  Pelvic exam and Pap test. This may be done every 3 years starting at age 62. Starting at age 60, this may be done every 5 years if you have a Pap test in combination with an HPV test. Other tests  Sexually transmitted disease (STD) testing.  Bone density scan. This is done to screen for osteoporosis. You may have this scan if you are at high risk for  osteoporosis. Follow these instructions at home: Eating and drinking  Eat a diet that includes fresh fruits and vegetables, whole grains, lean protein, and low-fat dairy.  Take vitamin and mineral supplements as recommended by your health care provider.  Do not drink alcohol if: ? Your health care provider tells you not to drink. ? You are pregnant, may be pregnant, or are planning to become pregnant.  If you drink alcohol: ? Limit how much you have to 0-1 drink a day. ? Be aware of how much alcohol is in your drink. In the [place] one drink equals one 12 oz bottle of beer (355 mL), one 5 oz glass of wine (148 mL), or one 1 oz glass of hard liquor (44 mL). Lifestyle  Take daily care of your teeth and gums.  Stay active. Exercise for at least 30 minutes on 5 or  more days each week.  Do not use any products that contain nicotine or tobacco, such as cigarettes, e-cigarettes, and chewing tobacco. If you need help quitting, ask your health care provider.  If you are sexually active, practice safe sex. Use a condom or other form of birth control (contraception) in order to prevent pregnancy and STIs (sexually transmitted infections).  If told by your health care provider, take low-dose aspirin daily starting at age 81. What's next?  Visit your health care provider once a year for a well check visit.  Ask your health care provider how often you should have your eyes and teeth checked.  Stay up to date on all vaccines. This information is not intended to replace advice given to you by your health care provider. Make sure you discuss any questions you have with your health care provider. Document Revised: 04/22/2018 Document Reviewed: 04/22/2018 Elsevier Patient Education  2020 [place], MD [facility]

## 2020-08-13 ENCOUNTER — Other Ambulatory Visit (INDEPENDENT_AMBULATORY_CARE_PROVIDER_SITE_OTHER): Payer: BC Managed Care – PPO

## 2020-08-13 DIAGNOSIS — E559 Vitamin D deficiency, unspecified: Secondary | ICD-10-CM

## 2020-08-13 DIAGNOSIS — D509 Iron deficiency anemia, unspecified: Secondary | ICD-10-CM

## 2020-08-13 LAB — IBC PANEL
Iron: 36 ug/dL — ABNORMAL LOW (ref 42–145)
Saturation Ratios: 8.1 % — ABNORMAL LOW (ref 20.0–50.0)
Transferrin: 318 mg/dL (ref 212.0–360.0)

## 2020-08-13 LAB — VITAMIN D 25 HYDROXY (VIT D DEFICIENCY, FRACTURES): VITD: 24.41 ng/mL — ABNORMAL LOW (ref 30.00–100.00)

## 2020-08-13 NOTE — Addendum Note (Signed)
Addended by: Vincenza Hews on: 08/13/2020 12:09 PM   Modules accepted: Orders

## 2020-08-22 DIAGNOSIS — M25561 Pain in right knee: Secondary | ICD-10-CM | POA: Diagnosis not present

## 2020-09-12 ENCOUNTER — Encounter: Payer: Self-pay | Admitting: Internal Medicine

## 2020-09-12 ENCOUNTER — Other Ambulatory Visit: Payer: Self-pay

## 2020-09-12 ENCOUNTER — Ambulatory Visit: Payer: BC Managed Care – PPO | Admitting: Internal Medicine

## 2020-09-12 VITALS — BP 130/100 | HR 75 | Temp 98.3°F | Wt 198.9 lb

## 2020-09-12 DIAGNOSIS — R030 Elevated blood-pressure reading, without diagnosis of hypertension: Secondary | ICD-10-CM

## 2020-09-12 DIAGNOSIS — R21 Rash and other nonspecific skin eruption: Secondary | ICD-10-CM | POA: Diagnosis not present

## 2020-09-12 NOTE — Progress Notes (Signed)
Established Patient Office Visit     This visit occurred during the SARS-CoV-2 public health emergency.  Safety protocols were in place, including screening questions prior to the visit, additional usage of staff PPE, and extensive cleaning of exam room while observing appropriate contact time as indicated for disinfecting solutions.    CC/Reason for Visit: Discuss rash  HPI: April Levine is a 50 y.o. female who is coming in today for the above mentioned reasons.  She has been noted to have elevated blood pressure occasionally while in office.  At last visit it had normalized.  She comes in today with a rash over her left knee.  She states that have been present for 2 to 3 days.  She put some over-the-counter hydrocortisone cream on it this morning and it has resolved.  It was pruritic.   Past Medical/Surgical History: Past Medical History:  Diagnosis Date  . Anemia   . Anemia   . Chicken pox   . DVT (deep vein thrombosis) in pregnancy 2003    and post surgery left leg  knee in preg  . MRSA (methicillin resistant staph aureus) culture positive     Past Surgical History:  Procedure Laterality Date  . KNEE ARTHROSCOPY     had dvt post op    Social History:  reports that she has never smoked. She has never used smokeless tobacco. She reports current alcohol use. She reports that she does not use drugs.  Allergies: No Known Allergies  Family History:  Family History  Problem Relation Age of Onset  . Alcohol abuse Other   . Arthritis Mother   . Hypertension Mother   . Pulmonary embolism Mother   . Dementia Father   . Stroke Father   . Alcohol abuse Maternal Grandmother     No current outpatient medications on file.  Review of Systems:  Constitutional: Denies fever, chills, diaphoresis, appetite change and fatigue.  HEENT: Denies photophobia, eye pain, redness, hearing loss, ear pain, congestion, sore throat, rhinorrhea, sneezing, mouth sores, trouble  swallowing, neck pain, neck stiffness and tinnitus.   Respiratory: Denies SOB, DOE, cough, chest tightness,  and wheezing.   Cardiovascular: Denies chest pain, palpitations and leg swelling.  Gastrointestinal: Denies nausea, vomiting, abdominal pain, diarrhea, constipation, blood in stool and abdominal distention.  Genitourinary: Denies dysuria, urgency, frequency, hematuria, flank pain and difficulty urinating.  Endocrine: Denies: hot or cold intolerance, sweats, changes in hair or nails, polyuria, polydipsia. Musculoskeletal: Denies myalgias, back pain, joint swelling, arthralgias and gait problem.  Skin: Denies pallor, rash and wound.  Neurological: Denies dizziness, seizures, syncope, weakness, light-headedness, numbness and headaches.  Hematological: Denies adenopathy. Easy bruising, personal or family bleeding history  Psychiatric/Behavioral: Denies suicidal ideation, mood changes, confusion, nervousness, sleep disturbance and agitation    Physical Exam: Vitals:   09/12/20 1329  BP: (!) 130/100  Pulse: 75  Temp: 98.3 F (36.8 C)  TempSrc: Oral  SpO2: 99%  Weight: 198 lb 14.4 oz (90.2 kg)    Body mass index is 30.69 kg/m.   Constitutional: NAD, calm, comfortable Eyes: PERRL, lids and conjunctivae normal ENMT: Mucous membranes are moist.  Respiratory: clear to auscultation bilaterally, no wheezing, no crackles. Normal respiratory effort. No accessory muscle use.  Cardiovascular: Regular rate and rhythm, no murmurs / rubs / gallops. No extremity edema. Neurologic: Grossly intact and nonfocal.  Psychiatric: Normal judgment and insight. Alert and oriented x 3. Normal mood.    Impression and Plan:  Rash -Unclear diagnosis,  but has completely resolved with OTC hydrocortisone.  Elevated BP without diagnosis of hypertension -She will start ambulatory measurements again and return in around 8-12 weeks for follow-up.    Chaya Jan, MD [facility] at  [facility]

## 2020-09-20 ENCOUNTER — Ambulatory Visit: Payer: BC Managed Care – PPO | Admitting: Family Medicine

## 2020-09-20 ENCOUNTER — Encounter: Payer: Self-pay | Admitting: Family Medicine

## 2020-09-20 ENCOUNTER — Other Ambulatory Visit: Payer: Self-pay

## 2020-09-20 VITALS — BP 138/94 | HR 99 | Temp 98.5°F | Wt 198.4 lb

## 2020-09-20 DIAGNOSIS — I10 Essential (primary) hypertension: Secondary | ICD-10-CM

## 2020-09-20 DIAGNOSIS — L242 Irritant contact dermatitis due to solvents: Secondary | ICD-10-CM | POA: Diagnosis not present

## 2020-09-20 NOTE — Progress Notes (Signed)
Subjective:    Patient ID: April Levine, female    DOB: [birth year], 50 y.o.   MRN: [id number]  No chief complaint on file.   HPI Patient is a 50 yo female with pmh sig for SVT during pregnancy, anemia, and HTN who is followed by Dr. Ardyth Harps and seen for acute concern.  Pt seen 09/12/2020 for rash on L knee, improved with cortisone cream.  Pt seen today for rash on fingers of both hands.  Pt notes rash appeared Tuesday night after having SNS nail polish soaked off at nail salon.  Pt typically has fingernails wrapped to remove the polish, but this time soaked her fingers in Acetone.  Pt now with puritic, dry, bumpy appearing skin of fingers.  Using cortisone cream at night and benadryl qhs.  Past Medical History:  Diagnosis Date  . Anemia   . Anemia   . Chicken pox   . DVT (deep vein thrombosis) in pregnancy 2003    and post surgery left leg  knee in preg  . MRSA (methicillin resistant staph aureus) culture positive     No Known Allergies  ROS General: Denies fever, chills, night sweats, changes in weight, changes in appetite HEENT: Denies headaches, ear pain, changes in vision, rhinorrhea, sore throat CV: Denies CP, palpitations, SOB, orthopnea Pulm: Denies SOB, cough, wheezing GI: Denies abdominal pain, nausea, vomiting, diarrhea, constipation GU: Denies dysuria, hematuria, frequency, vaginal discharge Msk: Denies muscle cramps, joint pains Neuro: Denies weakness, numbness, tingling Skin: Denies bruising +pruritic rash Psych: Denies depression, anxiety, hallucinations     Objective:    Blood pressure (!) 138/94, pulse 99, temperature 98.5 F (36.9 C), temperature source Oral, weight 198 lb 6.4 oz (90 kg), SpO2 99 %.  Gen. Pleasant, well-nourished, in no distress, normal affect  HEENT: [place]/AT, face symmetric, conjunctiva clear, no scleral icterus, PERRLA, EOMI, nares patent without drainage Lungs: no accessory muscle use Cardiovascular: RRR, no peripheral  edema Musculoskeletal: No deformities, no cyanosis or clubbing, normal tone Neuro:  A&Ox3, CN II-XII intact, normal gait Skin:  Warm, dry, intact.  B/l hands with extremely dry, rough appearing skin to PIP joints.   Wt Readings from Last 3 Encounters:  09/12/20 198 lb 14.4 oz (90.2 kg)  08/10/20 200 lb 9.6 oz (91 kg)  07/25/20 200 lb (90.7 kg)    Lab Results  Component Value Date   WBC 4.1 08/10/2020   HGB 10.2 (L) 08/10/2020   HCT 32.7 (L) 08/10/2020   PLT 304.0 08/10/2020   GLUCOSE 84 08/10/2020   CHOL 200 08/10/2020   TRIG 76.0 08/10/2020   HDL 72.60 08/10/2020   LDLCALC 112 (H) 08/10/2020   ALT 7 08/10/2020   AST 13 08/10/2020   NA 136 08/10/2020   K 3.8 08/10/2020   CL 104 08/10/2020   CREATININE 0.75 08/10/2020   BUN 11 08/10/2020   CO2 26 08/10/2020   TSH 1.47 08/10/2020   HGBA1C 5.8 08/10/2020    Assessment/Plan:  Irritant contact dermatitis due to solvent  -likely 2/2 acetone -pt advised to take a break from having nails done (SNS or acrylic polish) -ok to use cortisone cream sparingly qhs x 5-7 days to avoid thinning skin -use a thick unscented emolient cream/moisturizer during the day after washing hands -ok to use OTC allergy med for pruritis -use gloves when washing dishes. -given handout  HTN -f/u with pcp for further management -lifestyle modifications  f/u prn with PCP   Abbe Amsterdam, MD

## 2020-09-20 NOTE — Patient Instructions (Addendum)
It is ok to use the cortisone cream on your hands nightly for 5-7 days.  Use a gentle, unscented moisturizer throughout the day on your hands.  Ok to take an over the counter allergy medicine like claritin or Zyrtec if needed for the itching.  Contact Dermatitis Dermatitis is redness, soreness, and swelling (inflammation) of the skin. Contact dermatitis is a reaction to certain substances that touch the skin. Many different substances can cause contact dermatitis. There are two types of contact dermatitis:  Irritant contact dermatitis. This type is caused by something that irritates your skin, such as having dry hands from washing them too often with soap. This type does not require previous exposure to the substance for a reaction to occur. This is the most common type.  Allergic contact dermatitis. This type is caused by a substance that you are allergic to, such as poison ivy. This type occurs when you have been exposed to the substance (allergen) and develop a sensitivity to it. Dermatitis may develop soon after your first exposure to the allergen, or it may not develop until the next time you are exposed and every time thereafter. What are the causes? Irritant contact dermatitis is most commonly caused by exposure to:  Makeup.  Soaps.  Detergents.  Bleaches.  Acids.  Metal salts, such as nickel. Allergic contact dermatitis is most commonly caused by exposure to:  Poisonous plants.  Chemicals.  Jewelry.  Latex.  Medicines.  Preservatives in products, such as clothing. What increases the risk? You are more likely to develop this condition if you have:  A job that exposes you to irritants or allergens.  Certain medical conditions, such as asthma or eczema. What are the signs or symptoms? Symptoms of this condition may occur on your body anywhere the irritant has touched you or is touched by you.  Symptoms include: ? Dryness or  flaking. ? Redness. ? Cracks. ? Itching. ? Pain or a burning feeling. ? Blisters. ? Drainage of small amounts of blood or clear fluid from skin cracks. With allergic contact dermatitis, there may also be swelling in areas such as the eyelids, mouth, or genitals.   How is this diagnosed? This condition is diagnosed with a medical history and physical exam.  A patch skin test may be performed to help determine the cause.  If the condition is related to your job, you may need to see an occupational medicine specialist. How is this treated? This condition is treated by checking for the cause of the reaction and protecting your skin from further contact. Treatment may also include:  Steroid creams or ointments. Oral steroid medicines may be needed in more severe cases.  Antibiotic medicines or antibacterial ointments, if a skin infection is present.  Antihistamine lotion or an antihistamine taken by mouth to ease itching.  A bandage (dressing). Follow these instructions at home: Skin care  Moisturize your skin as needed.  Apply cool compresses to the affected areas.  Try applying baking soda paste to your skin. Stir water into baking soda until it reaches a paste-like consistency.  Do not scratch your skin, and avoid friction to the affected area.  Avoid the use of soaps, perfumes, and dyes. Medicines  Take or apply over-the-counter and prescription medicines only as told by your health care provider.  If you were prescribed an antibiotic medicine, take or apply the antibiotic as told by your health care provider. Do not stop using the antibiotic even if your condition improves. Bathing  Try  taking a bath with: ? Epsom salts. Follow the instructions on the packaging. You can get these at your local pharmacy or grocery store. ? Baking soda. Pour a small amount into the bath as directed by your health care provider. ? Colloidal oatmeal. Follow the instructions on the packaging.  You can get this at your local pharmacy or grocery store.  Bathe less frequently, such as every other day.  Bathe in lukewarm water. Avoid using hot water. Bandage care  If you were given a bandage (dressing), change it as told by your health care provider.  Wash your hands with soap and water before and after you change your dressing. If soap and water are not available, use hand sanitizer. General instructions  Avoid the substance that caused your reaction. If you do not know what caused it, keep a journal to try to track what caused it. Write down: ? What you eat. ? What cosmetic products you use. ? What you drink. ? What you wear in the affected area. This includes jewelry.  Check the affected areas every day for signs of infection. Check for: ? More redness, swelling, or pain. ? More fluid or blood. ? Warmth. ? Pus or a bad smell.  Keep all follow-up visits as told by your health care provider. This is important. Contact a health care provider if:  Your condition does not improve with treatment.  Your condition gets worse.  You have signs of infection such as swelling, tenderness, redness, soreness, or warmth in the affected area.  You have a fever.  You have new symptoms. Get help right away if:  You have a severe headache, neck pain, or neck stiffness.  You vomit.  You feel very sleepy.  You notice red streaks coming from the affected area.  Your bone or joint underneath the affected area becomes painful after the skin has healed.  The affected area turns darker.  You have difficulty breathing. Summary  Dermatitis is redness, soreness, and swelling (inflammation) of the skin. Contact dermatitis is a reaction to certain substances that touch the skin.  Symptoms of this condition may occur on your body anywhere the irritant has touched you or is touched by you.  This condition is treated by figuring out what caused the reaction and protecting your skin  from further contact. Treatment may also include medicines and skin care.  Avoid the substance that caused your reaction. If you do not know what caused it, keep a journal to try to track what caused it.  Contact a health care provider if your condition gets worse or you have signs of infection such as swelling, tenderness, redness, soreness, or warmth in the affected area. This information is not intended to replace advice given to you by your health care provider. Make sure you discuss any questions you have with your health care provider. Document Revised: 12/01/2018 Document Reviewed: 02/24/2018 Elsevier Patient Education  2021 [employer].

## 2020-10-04 ENCOUNTER — Telehealth (INDEPENDENT_AMBULATORY_CARE_PROVIDER_SITE_OTHER): Payer: BC Managed Care – PPO | Admitting: Internal Medicine

## 2020-10-04 ENCOUNTER — Other Ambulatory Visit: Payer: Self-pay

## 2020-10-04 ENCOUNTER — Encounter: Payer: Self-pay | Admitting: Internal Medicine

## 2020-10-04 VITALS — BP 128/78

## 2020-10-04 DIAGNOSIS — R112 Nausea with vomiting, unspecified: Secondary | ICD-10-CM | POA: Diagnosis not present

## 2020-10-04 DIAGNOSIS — R42 Dizziness and giddiness: Secondary | ICD-10-CM | POA: Diagnosis not present

## 2020-10-04 MED ORDER — ONDANSETRON 4 MG PO TBDP: 4.0000 mg | ORAL_TABLET | Freq: Three times a day (TID) | ORAL | 0 refills | Status: DC | PRN

## 2020-10-04 NOTE — Progress Notes (Signed)
   Virtual Visit via Telephone Note  I connected with@ on 10/04/20 at 10:00 AM EST by telephone and verified that I am speaking with the correct person using two identifiers.   I discussed the limitations, risks, security and privacy concerns of performing an evaluation and management service by telephone and the limited availability of in person appointments. tThere may be a patient responsible charge related to this service. The patient expressed understanding and agreed to proceed.  Location patient: car Location provider:  home office Participants present for the call: patient, provider Patient did not have a visit in the prior 7 days to address this/these issue(s).   History of Present Illness: April Levine presents today with new symptoms.  She was unable to connect because of technical difficulties to the video portion still doing a telephone visit.  Yesterday she felt tired after eating later after eating out.  So she went to bed today she has felt dizzy despite working and without fever or serious pain had some vomiting this morning.  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  No fainting blood pressure was 128/78.  No one else is sick She has been vaccinated but in her job she gets exposed to people who might of had Covid but no specific and wears a mask.  No cough congestion sore throat symptoms.   Observations/Objective: Patient sounds personable and well on the phone. I do not appreciate any SOB. Speech and thought processing are grossly intact. Patient reported vitals:  bp 128/78 Lab Results  Component Value Date   WBC 4.1 08/10/2020   HGB 10.2 (L) 08/10/2020   HCT 32.7 (L) 08/10/2020   PLT 304.0 08/10/2020   GLUCOSE 84 08/10/2020   CHOL 200 08/10/2020   TRIG 76.0 08/10/2020   HDL 72.60 08/10/2020   LDLCALC 112 (H) 08/10/2020   ALT 7 08/10/2020   AST 13 08/10/2020   NA 136 08/10/2020   K 3.8 08/10/2020   CL 104 08/10/2020   CREATININE 0.75 08/10/2020   BUN  11 08/10/2020   CO2 26 08/10/2020   TSH 1.47 08/10/2020   HGBA1C 5.8 08/10/2020    Assessment and Plan:  Non-intractable vomiting with nausea, unspecified vomiting type  Dizziness Sounds like viral prodrome versus vertigo. Agree she should be at home resting fluids we will send in Zofran she can try meclizine over-the-counter with caution of drowsiness fatigue. She could consider getting Covid tested and discussed types of testing.  Especially if she is interested in therapeutic interventions.  Expectant management and follow-up if not getting better or getting worse.  With alarm sx .   Follow Up Instructions:  See above    [id number] 5-10 [id number] 11-20 [id number] 21-30 I did not refer this patient for an OV in the next 24 hours for this/these issue(s).  I discussed the assessment and treatment plan with the patient. The patient was provided an opportunity to ask questions and answered. The patient agreed with the plan and demonstrated an understanding of the instructions.   The patient was advised to call back or seek an in-person evaluation if the symptoms worsen or if the condition fails to improve as anticipated.  I provided 13 minutes of non-face-to-face time during this encounter. Failed video visit  Return if symptoms worsen or fail to improve.  Berniece Andreas, MD

## 2020-10-27 ENCOUNTER — Other Ambulatory Visit: Payer: Self-pay | Admitting: Internal Medicine

## 2020-10-27 DIAGNOSIS — E559 Vitamin D deficiency, unspecified: Secondary | ICD-10-CM

## 2021-01-16 ENCOUNTER — Other Ambulatory Visit: Payer: Self-pay | Admitting: Internal Medicine

## 2021-01-16 ENCOUNTER — Telehealth: Payer: Self-pay | Admitting: *Deleted

## 2021-01-16 DIAGNOSIS — E559 Vitamin D deficiency, unspecified: Secondary | ICD-10-CM

## 2021-01-16 NOTE — Telephone Encounter (Signed)
Left message medication for Vitamin D refill and will need to schedule appointment.

## 2021-04-17 ENCOUNTER — Other Ambulatory Visit: Payer: Self-pay | Admitting: Internal Medicine

## 2021-04-17 DIAGNOSIS — E559 Vitamin D deficiency, unspecified: Secondary | ICD-10-CM

## 2021-07-23 ENCOUNTER — Telehealth: Payer: Self-pay | Admitting: Internal Medicine

## 2021-07-23 NOTE — Telephone Encounter (Signed)
Left voicemail for patient to call back to schedule physical due to her not having one since she's been a patient of Dr.Hernandez's.

## 2021-08-06 DIAGNOSIS — N920 Excessive and frequent menstruation with regular cycle: Secondary | ICD-10-CM | POA: Diagnosis not present

## 2021-08-06 DIAGNOSIS — Z1231 Encounter for screening mammogram for malignant neoplasm of breast: Secondary | ICD-10-CM | POA: Diagnosis not present

## 2021-08-06 DIAGNOSIS — D649 Anemia, unspecified: Secondary | ICD-10-CM | POA: Diagnosis not present

## 2021-08-06 DIAGNOSIS — Z30431 Encounter for routine checking of intrauterine contraceptive device: Secondary | ICD-10-CM | POA: Diagnosis not present

## 2021-08-06 DIAGNOSIS — Z6830 Body mass index (BMI) 30.0-30.9, adult: Secondary | ICD-10-CM | POA: Diagnosis not present

## 2021-08-06 DIAGNOSIS — Z01419 Encounter for gynecological examination (general) (routine) without abnormal findings: Secondary | ICD-10-CM | POA: Diagnosis not present

## 2021-08-06 DIAGNOSIS — Z113 Encounter for screening for infections with a predominantly sexual mode of transmission: Secondary | ICD-10-CM | POA: Diagnosis not present

## 2021-08-07 ENCOUNTER — Telehealth: Payer: Self-pay | Admitting: Internal Medicine

## 2021-08-07 DIAGNOSIS — Z01419 Encounter for gynecological examination (general) (routine) without abnormal findings: Secondary | ICD-10-CM | POA: Diagnosis not present

## 2021-08-07 NOTE — Telephone Encounter (Signed)
Patient calling in with respiratory symptoms: Shortness of breath, chest pain, palpitations or other red words send to Triage  Does the patient have a fever over 100, cough, congestion, sore throat, runny nose, lost of taste/smell (please list symptoms that patient has)? Cough over 5 days  What date did symptoms start? 08-01-2021 (If over 5 days ago, pt may be scheduled for in person visit)  Have you tested for Covid in the last 5 days? No   If yes, was it positive []  OR negative [] ? If positive in the last 5 days, please schedule virtual visit now. If negative, schedule for an in person OV with the next available provider if PCP has no openings. Please also let patient know they will be tested again (follow the script below)  "you will have to arrive prior to your appt time to be Covid tested. Please park in back of office at the [facility] & call [phone] to let the staff know you have arrived. A staff member will meet you at your car to do a rapid covid test. Once the test has resulted you will be notified by phone of your results to determine if appt will remain an in person visit or be converted to a virtual/phone visit. If you arrive less than before your appt time, your visit will be automatically converted to virtual & any recommended testing will happen AFTER the visit."  Pt has an appt with dr [phone] on 08-13-2021  THINGS TO REMEMBER  If no availability for virtual visit in office,  please schedule another [place] office  If no availability at another [place] office, please instruct patient that they can schedule an evisit or virtual visit through their mychart account. Visits up to 8pm  patients can be seen in office 5 days after positive COVID test

## 2021-08-09 LAB — HM PAP SMEAR: HPV, high-risk: NEGATIVE

## 2021-08-13 ENCOUNTER — Encounter: Payer: Self-pay | Admitting: Internal Medicine

## 2021-08-13 ENCOUNTER — Ambulatory Visit: Payer: BC Managed Care – PPO | Admitting: Internal Medicine

## 2021-08-13 VITALS — BP 118/82 | HR 67 | Temp 98.1°F | Ht 67.5 in | Wt 193.9 lb

## 2021-08-13 DIAGNOSIS — R052 Subacute cough: Secondary | ICD-10-CM

## 2021-08-13 DIAGNOSIS — Z23 Encounter for immunization: Secondary | ICD-10-CM

## 2021-08-13 MED ORDER — BENZONATATE 100 MG PO CAPS: 100.0000 mg | ORAL_CAPSULE | Freq: Two times a day (BID) | ORAL | 0 refills | Status: DC | PRN

## 2021-08-13 NOTE — Progress Notes (Signed)
Acute office Visit     This visit occurred during the SARS-CoV-2 public health emergency.  Safety protocols were in place, including screening questions prior to the visit, additional usage of staff PPE, and extensive cleaning of exam room while observing appropriate contact time as indicated for disinfecting solutions.    CC/Reason for Visit: Cough  HPI: April Levine is a 50 y.o. female who is coming in today for the above mentioned reasons.  Her 53-month-old grandson had RSV.  She developed URI symptoms afterwards.  It has now been 7 days.  All symptoms have resolved with the exception of a dry, hacking cough.  She feels well otherwise.  She is due for flu and Shingrix vaccine and is wanting to obtain them today.  Past Medical/Surgical History: Past Medical History:  Diagnosis Date   Anemia    Anemia    Chicken pox    DVT (deep vein thrombosis) in pregnancy 2003    and post surgery left leg  knee in preg   MRSA (methicillin resistant staph aureus) culture positive     Past Surgical History:  Procedure Laterality Date   KNEE ARTHROSCOPY     had dvt post op    Social History:  reports that she has never smoked. She has never used smokeless tobacco. She reports current alcohol use. She reports that she does not use drugs.  Allergies: No Known Allergies  Family History:  Family History  Problem Relation Age of Onset   Alcohol abuse Other    Arthritis Mother    Hypertension Mother    Pulmonary embolism Mother    Dementia Father    Stroke Father    Alcohol abuse Maternal Grandmother      Current Outpatient Medications:    benzonatate (TESSALON) 100 MG capsule, Take 1 capsule (100 mg total) by mouth 2 (two) times daily as needed for cough., Disp: 20 capsule, Rfl: 0   ondansetron (ZOFRAN-ODT) 4 MG disintegrating tablet, Take 1 tablet (4 mg total) by mouth every 8 (eight) hours as needed for nausea or vomiting. (Patient not taking: Reported on 08/13/2021), Disp: 15  tablet, Rfl: 0  Review of Systems:  Constitutional: Denies fever, chills, diaphoresis, appetite change and fatigue.  HEENT: Denies photophobia, eye pain, redness, hearing loss, ear pain, congestion, sore throat, rhinorrhea, sneezing, mouth sores, trouble swallowing, neck pain, neck stiffness and tinnitus.   Respiratory: Denies SOB, DOE,  chest tightness,  and wheezing.   Cardiovascular: Denies chest pain, palpitations and leg swelling.  Gastrointestinal: Denies nausea, vomiting, abdominal pain, diarrhea, constipation, blood in stool and abdominal distention.  Genitourinary: Denies dysuria, urgency, frequency, hematuria, flank pain and difficulty urinating.  Endocrine: Denies: hot or cold intolerance, sweats, changes in hair or nails, polyuria, polydipsia. Musculoskeletal: Denies myalgias, back pain, joint swelling, arthralgias and gait problem.  Skin: Denies pallor, rash and wound.  Neurological: Denies dizziness, seizures, syncope, weakness, light-headedness, numbness and headaches.  Hematological: Denies adenopathy. Easy bruising, personal or family bleeding history  Psychiatric/Behavioral: Denies suicidal ideation, mood changes, confusion, nervousness, sleep disturbance and agitation    Physical Exam: Vitals:   08/13/21 1300  BP: 118/82  Pulse: 67  Temp: 98.1 F (36.7 C)  TempSrc: Oral  SpO2: 99%  Weight: 193 lb 14.4 oz (88 kg)  Height: 5' 7.5" (1.715 m)    Body mass index is 29.92 kg/m.   Constitutional: NAD, calm, comfortable Eyes: PERRL, lids and conjunctivae normal ENMT: Mucous membranes are moist. Posterior pharynx is erythematous but  clear of any exudate or lesions. Respiratory: clear to auscultation bilaterally, no wheezing, no crackles. Normal respiratory effort. No accessory muscle use.  Cardiovascular: Regular rate and rhythm, no murmurs / rubs / gallops. No extremity edema.  Neurologic: Grossly intact and nonfocal Psychiatric: Normal judgment and insight. Alert  and oriented x 3. Normal mood.    Impression and Plan:  Subacute cough  - Plan: benzonatate (TESSALON) 100 MG capsule -Suspect a post URI/bronchitis cough.  Clear lung auscultation is reassuring. -Tessalon perles as needed for coughing.  Needs flu shot  - Plan: Flu Vaccine QUAD 6+ mos PF IM (Fluarix Quad PF)  Need for shingles vaccine  - Plan: Varicella-zoster vaccine IM (Shingrix)  Time spent: 21 minutes reviewing chart, interviewing and examining patient and formulating plan of care.     Chaya Jan, MD [facility] at [facility]

## 2021-08-27 DIAGNOSIS — Z13820 Encounter for screening for osteoporosis: Secondary | ICD-10-CM | POA: Diagnosis not present

## 2021-08-27 DIAGNOSIS — D509 Iron deficiency anemia, unspecified: Secondary | ICD-10-CM | POA: Diagnosis not present

## 2021-08-30 ENCOUNTER — Telehealth: Payer: Self-pay

## 2021-08-30 DIAGNOSIS — R21 Rash and other nonspecific skin eruption: Secondary | ICD-10-CM | POA: Diagnosis not present

## 2021-08-30 NOTE — Telephone Encounter (Signed)
--  Caller states she has a rash on her cheek. Left side of cheek, raised. It's sensitive. Used witchhazel. Looks like hives.   08/30/2021 9:39:19 AM See PCP within 24 Hours Holt, RN, Marylu Lund  08/30/21 1218: Rash itches. Noticed this AM when she awakened. States it looks like a pimple that has a white center that is not ready to burst.  She denies use of new lotions/creams, detergents, food, meds & states this has not happened in the past. Pt is requesting a steroid cream. Advised that she can try an OTC hydrocortisone, but continues to express desire for prescription cream. Pt advised that PCP is out of office today & this request will be sent to a different provider.

## 2021-09-02 NOTE — Telephone Encounter (Signed)
Pt states she "went somewhere else & he prescribed a cream". Pt has no further needs at this time.

## 2021-09-06 DIAGNOSIS — M79671 Pain in right foot: Secondary | ICD-10-CM | POA: Diagnosis not present

## 2021-09-06 DIAGNOSIS — M79672 Pain in left foot: Secondary | ICD-10-CM | POA: Diagnosis not present

## 2021-09-06 DIAGNOSIS — L600 Ingrowing nail: Secondary | ICD-10-CM | POA: Diagnosis not present

## 2021-09-06 DIAGNOSIS — B351 Tinea unguium: Secondary | ICD-10-CM | POA: Diagnosis not present

## 2021-10-07 DIAGNOSIS — B351 Tinea unguium: Secondary | ICD-10-CM | POA: Diagnosis not present

## 2021-10-07 DIAGNOSIS — M79674 Pain in right toe(s): Secondary | ICD-10-CM | POA: Diagnosis not present

## 2021-10-07 DIAGNOSIS — L600 Ingrowing nail: Secondary | ICD-10-CM | POA: Diagnosis not present

## 2021-10-07 DIAGNOSIS — M79675 Pain in left toe(s): Secondary | ICD-10-CM | POA: Diagnosis not present

## 2022-05-06 ENCOUNTER — Ambulatory Visit (INDEPENDENT_AMBULATORY_CARE_PROVIDER_SITE_OTHER): Payer: BC Managed Care – PPO

## 2022-05-06 ENCOUNTER — Ambulatory Visit: Payer: BC Managed Care – PPO | Admitting: Orthopaedic Surgery

## 2022-05-06 DIAGNOSIS — M25562 Pain in left knee: Secondary | ICD-10-CM | POA: Diagnosis not present

## 2022-05-06 DIAGNOSIS — G8929 Other chronic pain: Secondary | ICD-10-CM | POA: Diagnosis not present

## 2022-05-06 MED ORDER — BUPIVACAINE HCL 0.5 % IJ SOLN
2.0000 mL | INTRAMUSCULAR | Status: AC | PRN
  Administered 2022-05-06: 2 mL via INTRA_ARTICULAR

## 2022-05-06 MED ORDER — LIDOCAINE HCL 1 % IJ SOLN
2.0000 mL | INTRAMUSCULAR | Status: AC | PRN
  Administered 2022-05-06: 2 mL

## 2022-05-06 MED ORDER — METHYLPREDNISOLONE ACETATE 40 MG/ML IJ SUSP
40.0000 mg | INTRAMUSCULAR | Status: AC | PRN
  Administered 2022-05-06: 40 mg via INTRA_ARTICULAR

## 2022-05-06 NOTE — Progress Notes (Signed)
Office Visit Note   Patient: April Levine           Date of Birth: 1971/08/13           MRN: [id number] Visit Date: 05/06/2022              Requested by: Philip Aspen, Limmie Patricia, MD [address] [place],  [place] [address] PCP: Philip Aspen, Limmie Patricia, MD   Assessment & Plan: Visit Diagnoses:  1. Chronic pain of left knee     Plan: Impression is left knee pain likely due to OA exacerbation.  Joint effusion aspirated with 30 cc of fluid sent to the lab.  Cortisone injected as well.  [employer] pamphlet provided.  Follow-up as needed.  Follow-Up Instructions: No follow-ups on file.   Orders:  Orders Placed This Encounter  Procedures   Large Joint Inj   XR KNEE 3 VIEW LEFT   Synovial Fluid Analysis, Complete   No orders of the defined types were placed in this encounter.     Procedures: Large Joint Inj: L knee on 05/06/2022 8:34 PM Details: 22 G needle Medications: 2 mL bupivacaine 0.5 %; 2 mL lidocaine 1 %; 40 mg methylPREDNISolone acetate 40 MG/ML Aspirate: 30 mL; sent for lab analysis Outcome: tolerated well, no immediate complications Patient was prepped and draped in the usual sterile fashion.       Clinical Data: No additional findings.   Subjective: Chief Complaint  Patient presents with   Left Knee - Pain    HPI April Levine is a very pleasant 51 year old female here for left knee pain for about 2 weeks.  Pain is worse with walking up steps.  Underwent left knee arthroscopy and partial lateral meniscectomy about 20 years ago.  Unfortunately she got a DVT afterwards and was treated on anticoagulant for 6 months.  She has been using Biofreeze and Voltaren gel on her knee without much relief.  Denies any mechanical symptoms or injuries.  Review of Systems  Constitutional: Negative.   HENT: Negative.    Eyes: Negative.   Respiratory: Negative.    Cardiovascular: Negative.   Endocrine: Negative.   Musculoskeletal: Negative.   Neurological: Negative.    Hematological: Negative.   Psychiatric/Behavioral: Negative.    All other systems reviewed and are negative.    Objective: Vital Signs: There were no vitals taken for this visit.  Physical Exam Vitals and nursing note reviewed.  Constitutional:      Appearance: She is well-developed.  HENT:     Head: Atraumatic.     Nose: Nose normal.  Eyes:     Extraocular Movements: Extraocular movements intact.  Cardiovascular:     Pulses: Normal pulses.  Pulmonary:     Effort: Pulmonary effort is normal.  Abdominal:     Palpations: Abdomen is soft.  Musculoskeletal:     Cervical back: Neck supple.  Skin:    General: Skin is warm.     Capillary Refill: Capillary refill takes less than 2 seconds.  Neurological:     Mental Status: She is alert. Mental status is at baseline.  Psychiatric:        Behavior: Behavior normal.        Thought Content: Thought content normal.        Judgment: Judgment normal.     Ortho Exam Examination of left knee shows moderate joint effusion.  Slight lateral joint line tenderness.  Collaterals and cruciates are stable. Specialty Comments:  No specialty comments available.  Imaging:  XR KNEE 3 VIEW LEFT  Result Date: 05/06/2022 Osteoarthritis most pronounced in the lateral compartment with spurring.    PMFS History: Patient Active Problem List   Diagnosis Date Noted   Vitamin D deficiency 08/10/2020   Hyperlipidemia 08/10/2020   Anemia, iron deficiency 09/30/2013   Visit for preventive health examination 06/15/2013   Abscess of abdominal wall 07/25/2011   Foot pain, right 12/30/2010   THYROID CYST 04/02/2010   GOITER, UNSPECIFIED 03/11/2010   ANEMIA-NOS 03/12/2009   Past Medical History:  Diagnosis Date   Anemia    Anemia    Chicken pox    DVT (deep vein thrombosis) in pregnancy 2003    and post surgery left leg  knee in preg   MRSA (methicillin resistant staph aureus) culture positive     Family History  Problem Relation Age of  Onset   Alcohol abuse Other    Arthritis Mother    Hypertension Mother    Pulmonary embolism Mother    Dementia Father    Stroke Father    Alcohol abuse Maternal Grandmother     Past Surgical History:  Procedure Laterality Date   KNEE ARTHROSCOPY     had dvt post op   Social History   Occupational History   Not on file  Tobacco Use   Smoking status: Never   Smokeless tobacco: Never  Substance and Sexual Activity   Alcohol use: Yes    Alcohol/week: 0.0 standard drinks of alcohol    Comment: 4 oz rum every other night   Drug use: No   Sexual activity: Yes    Birth control/protection: I.U.D.    Comment: paraguard

## 2022-05-07 LAB — SYNOVIAL FLUID ANALYSIS, COMPLETE
Basophils, %: 0 %
Eosinophils-Synovial: 0 % (ref 0–2)
Lymphocytes-Synovial Fld: 15 % (ref 0–74)
Monocyte/Macrophage: 15 % (ref 0–69)
Neutrophil, Synovial: 70 % — ABNORMAL HIGH (ref 0–24)
Synoviocytes, %: 0 % (ref 0–15)
WBC, Synovial: 345 cells/uL — ABNORMAL HIGH (ref <150)

## 2022-06-04 DIAGNOSIS — N760 Acute vaginitis: Secondary | ICD-10-CM | POA: Diagnosis not present

## 2022-06-04 DIAGNOSIS — D649 Anemia, unspecified: Secondary | ICD-10-CM | POA: Diagnosis not present

## 2022-07-03 ENCOUNTER — Ambulatory Visit: Payer: BC Managed Care – PPO | Admitting: Physician Assistant

## 2022-07-03 ENCOUNTER — Encounter: Payer: Self-pay | Admitting: Physician Assistant

## 2022-07-03 VITALS — BP 137/80 | HR 103 | Temp 97.5°F | Ht 67.5 in | Wt 200.8 lb

## 2022-07-03 DIAGNOSIS — J069 Acute upper respiratory infection, unspecified: Secondary | ICD-10-CM | POA: Diagnosis not present

## 2022-07-03 LAB — POCT INFLUENZA A/B
Influenza A, POC: NEGATIVE
Influenza B, POC: NEGATIVE

## 2022-07-03 LAB — POC COVID19 BINAXNOW: SARS Coronavirus 2 Ag: NEGATIVE

## 2022-07-03 MED ORDER — GUAIFENESIN-CODEINE 100-10 MG/5ML PO SYRP: 5.0000 mL | ORAL_SOLUTION | Freq: Three times a day (TID) | ORAL | 0 refills | Status: DC | PRN

## 2022-07-03 MED ORDER — BENZONATATE 100 MG PO CAPS: 100.0000 mg | ORAL_CAPSULE | Freq: Three times a day (TID) | ORAL | 0 refills | Status: DC | PRN

## 2022-07-03 NOTE — Progress Notes (Signed)
Subjective:    Patient ID: April Levine, female    DOB: 25-Aug-1971, 51 y.o.   MRN: [id number]  Chief Complaint  Patient presents with   Nasal Congestion    Pt states sx started yesterday. Pt has tried nightquil, pt wants covid, strep and flu testing.  pt then declined strep and only wanted flu and covid   Sore Throat    Sore Throat    Patient is in today for acute sick symptoms. Worse yesterday. No fever. Some body aches and fatigue. A lot of coughing and congestion. ST. Some headache. She has been taking Nyquil.  No CP or SOB. No dizziness.  Grandson in daycare and has had runny nose.    Past Medical History:  Diagnosis Date   Anemia    Anemia    Chicken pox    DVT (deep vein thrombosis) in pregnancy 2003    and post surgery left leg  knee in preg   MRSA (methicillin resistant staph aureus) culture positive     Past Surgical History:  Procedure Laterality Date   KNEE ARTHROSCOPY     had dvt post op    Family History  Problem Relation Age of Onset   Alcohol abuse Other    Arthritis Mother    Hypertension Mother    Pulmonary embolism Mother    Dementia Father    Stroke Father    Alcohol abuse Maternal Grandmother     Social History   Tobacco Use   Smoking status: Never   Smokeless tobacco: Never  Substance Use Topics   Alcohol use: Yes    Alcohol/week: 0.0 standard drinks of alcohol    Comment: 4 oz rum every other night   Drug use: No     No Known Allergies  Review of Systems NEGATIVE UNLESS OTHERWISE INDICATED IN HPI      Objective:     BP 137/80 (BP Location: Left Arm, Patient Position: Sitting)   Pulse (!) 103   Temp (!) 97.5 F (36.4 C) (Temporal)   Ht 5' 7.5" (1.715 m)   Wt 200 lb 12.8 oz (91.1 kg)   LMP 07/02/2022   SpO2 98%   BMI 30.99 kg/m   Wt Readings from Last 3 Encounters:  07/03/22 200 lb 12.8 oz (91.1 kg)  08/13/21 193 lb 14.4 oz (88 kg)  09/20/20 198 lb 6.4 oz (90 kg)    BP Readings from Last 3 Encounters:   07/03/22 137/80  08/13/21 118/82  10/04/20 128/78     Physical Exam Vitals and nursing note reviewed.  Constitutional:      General: She is not in acute distress.    Appearance: Normal appearance. She is ill-appearing.  HENT:     Head: Normocephalic.     Right Ear: Tympanic membrane, ear canal and external ear normal.     Left Ear: Tympanic membrane, ear canal and external ear normal.     Nose: Congestion present.     Mouth/Throat:     Mouth: Mucous membranes are moist.     Pharynx: No oropharyngeal exudate or posterior oropharyngeal erythema.  Eyes:     Extraocular Movements: Extraocular movements intact.     Conjunctiva/sclera: Conjunctivae normal.     Pupils: Pupils are equal, round, and reactive to light.  Cardiovascular:     Rate and Rhythm: Normal rate and regular rhythm.     Pulses: Normal pulses.     Heart sounds: Normal heart sounds. No murmur heard. Pulmonary:  Effort: Pulmonary effort is normal. No respiratory distress.     Breath sounds: Normal breath sounds. No wheezing.     Comments: Cough present Musculoskeletal:     Cervical back: Normal range of motion.  Skin:    General: Skin is warm.  Neurological:     Mental Status: She is alert and oriented to person, place, and time.  Psychiatric:        Mood and Affect: Mood normal.        Behavior: Behavior normal.        Assessment & Plan:  Acute upper respiratory infection -     POCT Influenza A/B -     POC COVID-19 BinaxNow  Other orders -     Benzonatate; Take 1 capsule (100 mg total) by mouth 3 (three) times daily as needed for cough.  Dispense: 20 capsule; Refill: 0 -     guaiFENesin-Codeine; Take 5 mLs by mouth 3 (three) times daily as needed for cough.  Dispense: 120 mL; Refill: 0   Reassured patient that point-of-care flu and COVID-19 test were negative.  She may want to retest for COVID-19 in the next 24 to 48 hours.  If she does end up returning positive, we can certainly consider antiviral  treatment. Treat as viral upper respiratory infection.  She may try Tessalon Perles to help with cough or the Cheratussin cough syrup.  Caution with driving.  Rest and push fluids.    Return if symptoms worsen or fail to improve.  This note was prepared with assistance of Conservation officer, historic buildings. Occasional wrong-word or sound-a-like substitutions may have occurred due to the inherent limitations of voice recognition software.    Amiyrah Lamere M Aeriana Speece, PA-C

## 2022-07-28 ENCOUNTER — Ambulatory Visit: Payer: BC Managed Care – PPO | Admitting: Family Medicine

## 2022-07-28 VITALS — BP 118/82 | HR 89 | Temp 97.7°F | Ht 67.5 in | Wt 201.4 lb

## 2022-07-28 DIAGNOSIS — R059 Cough, unspecified: Secondary | ICD-10-CM

## 2022-07-28 LAB — POC COVID19 BINAXNOW: SARS Coronavirus 2 Ag: NEGATIVE

## 2022-07-28 MED ORDER — AZITHROMYCIN 250 MG PO TABS: ORAL_TABLET | ORAL | 0 refills | Status: DC

## 2022-07-28 MED ORDER — PROMETHAZINE-DM 6.25-15 MG/5ML PO SYRP: 5.0000 mL | ORAL_SOLUTION | Freq: Four times a day (QID) | ORAL | 0 refills | Status: DC | PRN

## 2022-07-28 MED ORDER — AZELASTINE HCL 0.1 % NA SOLN: 2.0000 | Freq: Two times a day (BID) | NASAL | 12 refills | Status: DC

## 2022-07-28 NOTE — Patient Instructions (Signed)
It was very nice to see you today!  Please try the nasal spray and cough medication.  Use the antibiotic if your symptoms are not improving.  Take care, Dr Jimmey Ralph  PLEASE NOTE:  If you had any lab tests please let us know if you have not heard back within a few days. You may see your results on mychart before we have a chance to review them but we will give you a call once they are reviewed by [place]. If we ordered any referrals today, please let us know if you have not heard from their office within the next week.   Please try these tips to maintain a healthy lifestyle:  Eat at least 3 REAL meals and 1-2 snacks per day.  Aim for no more than 5 hours between eating.  If you eat breakfast, please do so within one hour of getting up.   Each meal should contain half fruits/vegetables, one quarter protein, and one quarter carbs (no bigger than a computer mouse)  Cut down on sweet beverages. This includes juice, soda, and sweet tea.   Drink at least 1 glass of water with each meal and aim for at least 8 glasses per day  Exercise at least 150 minutes every week.

## 2022-07-28 NOTE — Progress Notes (Signed)
   April Levine is a 50 y.o. female who presents today for an office visit.  Assessment/Plan:  Cough Rapid COVID-negative.  No red flag signs or symptoms.  Reassuring exam today.  Likely has viral URI.  Start Astelin.  Also send in promethazine-dextromethorphan cough syrup.  Encouraged hydration.  She can continue over-the-counter meds as needed.  Did send in a pocket prescription for azithromycin with instruction to not start unless symptoms are not improving over the next several days.  We discussed reasons to return to care.  Follow-up as needed.    Subjective:  HPI:  Patient here with cough and congestion. Started about 3 days ago. April Levine has been sick with similar symptoms. Tried several OTC medications without much improvement. Some chills. No chest pain or shortness of breath.        Objective:  Physical Exam: BP 118/82   Pulse 89   Temp 97.7 F (36.5 C) (Temporal)   Ht 5' 7.5" (1.715 m)   Wt 201 lb 6.4 oz (91.4 kg)   LMP 07/02/2022   SpO2 100%   BMI 31.08 kg/m   Gen: No acute distress, resting comfortably HEENT: TMs clear effusion.  OP erythematous.  No exudate. CV: Regular rate and rhythm with no murmurs appreciated Pulm: Normal work of breathing, clear to auscultation bilaterally with no crackles, wheezes, or rhonchi Neuro: Grossly normal, moves all extremities Psych: Normal affect and thought content      April Levine M. Jimmey Ralph, MD 07/28/2022 3:10 PM

## 2022-08-17 ENCOUNTER — Encounter (HOSPITAL_COMMUNITY): Payer: Self-pay | Admitting: *Deleted

## 2022-08-17 ENCOUNTER — Ambulatory Visit (HOSPITAL_COMMUNITY)
Admission: EM | Admit: 2022-08-17 | Discharge: 2022-08-17 | Disposition: A | Discharge status: Home / Self Care | Payer: BC Managed Care – PPO | Attending: Internal Medicine | Admitting: Internal Medicine

## 2022-08-17 ENCOUNTER — Other Ambulatory Visit: Payer: Self-pay

## 2022-08-17 DIAGNOSIS — J019 Acute sinusitis, unspecified: Secondary | ICD-10-CM | POA: Diagnosis not present

## 2022-08-17 DIAGNOSIS — R062 Wheezing: Secondary | ICD-10-CM

## 2022-08-17 DIAGNOSIS — B9689 Other specified bacterial agents as the cause of diseases classified elsewhere: Secondary | ICD-10-CM

## 2022-08-17 DIAGNOSIS — J40 Bronchitis, not specified as acute or chronic: Secondary | ICD-10-CM | POA: Diagnosis not present

## 2022-08-17 MED ORDER — PROMETHAZINE-DM 6.25-15 MG/5ML PO SYRP: 5.0000 mL | ORAL_SOLUTION | Freq: Every evening | ORAL | 0 refills | Status: DC | PRN

## 2022-08-17 MED ORDER — AEROCHAMBER PLUS FLO-VU MISC
1.0000 | Freq: Once | Status: AC
  Administered 2022-08-17: 1

## 2022-08-17 MED ORDER — PREDNISONE 20 MG PO TABS
40.0000 mg | ORAL_TABLET | Freq: Once | ORAL | Status: AC
  Administered 2022-08-17: 40 mg via ORAL

## 2022-08-17 MED ORDER — PREDNISONE 20 MG PO TABS: 40.0000 mg | ORAL_TABLET | Freq: Every day | ORAL | 0 refills | Status: AC

## 2022-08-17 MED ORDER — ALBUTEROL SULFATE HFA 108 (90 BASE) MCG/ACT IN AERS
INHALATION_SPRAY | RESPIRATORY_TRACT | Status: AC
  Filled 2022-08-17: qty 6.7

## 2022-08-17 MED ORDER — ALBUTEROL SULFATE HFA 108 (90 BASE) MCG/ACT IN AERS
2.0000 | INHALATION_SPRAY | Freq: Once | RESPIRATORY_TRACT | Status: AC
  Administered 2022-08-17: 2 via RESPIRATORY_TRACT

## 2022-08-17 MED ORDER — AMOXICILLIN-POT CLAVULANATE 875-125 MG PO TABS: 1.0000 | ORAL_TABLET | Freq: Two times a day (BID) | ORAL | 0 refills | Status: DC

## 2022-08-17 MED ORDER — PREDNISONE 20 MG PO TABS
ORAL_TABLET | ORAL | Status: AC
  Filled 2022-08-17: qty 2

## 2022-08-17 MED ORDER — AEROCHAMBER PLUS FLO-VU LARGE MISC
Status: AC
  Filled 2022-08-17: qty 1

## 2022-08-17 NOTE — Discharge Instructions (Addendum)
Take Augmentin antibiotic twice daily for the next 7 days to treat bacterial sinus infection.  This is a stronger antibiotic than the azithromycin that you are given 2 weeks ago.  I hear wheezing in your chest, this is indicative of bronchitis which is inflammation of the upper airways.  Take prednisone 40 mg once daily with breakfast for the next 4 days.  I gave you your first dose at urgent care.  While you are taking this medicine, do not take any ibuprofen because this is a sister medicine.  Take this medicine with food to avoid stomach upset.  You may continue taking over-the-counter decongestants as needed.   Take Promethazine DM cough medication to help with your cough at nighttime so that you are able to sleep. Do not drive, drink alcohol, or go to work while taking this medication since it can make you sleepy. Only take this at nighttime.   May use albuterol inhaler every 4-6 hours as needed for cough, shortness of breath, and wheeze.  If you develop any new or worsening symptoms or do not improve in the next 2 to 3 days, please return.  If your symptoms are severe, please go to the emergency room.  Follow-up with your primary care provider for further evaluation and management of your symptoms as well as ongoing wellness visits.  I hope you feel better!

## 2022-08-17 NOTE — ED Triage Notes (Signed)
Pt c/o nasal congestion, cough, runny nose onset approx 1 month ago; states had been improving, but then starting worsening again this past week. Denies fevers. States grandchild recently with RSV. Has been taking Nyquil, TherFlu, pseudoephedrine, nasal spray.

## 2022-08-17 NOTE — ED Provider Notes (Signed)
[facility]    CSN: [id number] Arrival date & time: 08/17/22  1401      History   Chief Complaint Chief Complaint  Patient presents with   Cough   Nasal Congestion    HPI April Levine Levine is a 51 y.o. female.   Patient presents urgent care for evaluation of cough, nasal congestion, shortness of breath, and generalized fatigue for the last 8 to 9 days.  Patient has been sick off and on over the last several months due to exposure to her grandson who attends daycare and is frequently sick.  April Levine Levine is currently recovering from RSV.  No other known sick contacts.  She denies chest pain, heart palpitations, history of chronic respiratory problems, and headache.   Cough   Past Medical History:  Diagnosis Date   Anemia    Anemia    Chicken pox    DVT (deep vein thrombosis) in pregnancy 2003    and post surgery left leg  knee in preg   MRSA (methicillin resistant staph aureus) culture positive     Patient Active Problem List   Diagnosis Date Noted   Vitamin D deficiency 08/10/2020   Hyperlipidemia 08/10/2020   Anemia, iron deficiency 09/30/2013   Visit for preventive health examination 06/15/2013   Abscess of abdominal wall 07/25/2011   Foot pain, right 12/30/2010   THYROID CYST 04/02/2010   GOITER, UNSPECIFIED 03/11/2010   ANEMIA-NOS 03/12/2009    Past Surgical History:  Procedure Laterality Date   KNEE ARTHROSCOPY     had dvt post op    OB History     Gravida  6   Para  2   Term      Preterm      AB      Living         SAB      IAB      Ectopic      Multiple      Live Births               Home Medications    Prior to Admission medications   Medication Sig Start Date End Date Taking? Authorizing Provider  amoxicillin-clavulanate (AUGMENTIN) 875-125 MG tablet Take 1 tablet by mouth every 12 (twelve) hours. 08/17/22  Yes Carlisle Beers, FNP  azelastine (ASTELIN) 0.1 % nasal spray Place 2 sprays into both nostrils 2  (two) times daily. 07/28/22  Yes Ardith Dark, MD  promethazine-dextromethorphan (PROMETHAZINE-DM) 6.25-15 MG/5ML syrup Take 5 mLs by mouth at bedtime as needed for cough. 08/17/22  Yes Carlisle Beers, FNP  azithromycin (ZITHROMAX) 250 MG tablet Take 2 tabs day 1, then 1 tab daily 07/28/22   Ardith Dark, MD    Family History Family History  Problem Relation Age of Onset   Alcohol abuse Other    Arthritis Mother    Hypertension Mother    Pulmonary embolism Mother    Dementia Father    Stroke Father    Alcohol abuse Maternal Grandmother     Social History Social History   Tobacco Use   Smoking status: Never   Smokeless tobacco: Never  Vaping Use   Vaping Use: Never used  Substance Use Topics   Alcohol use: Yes    Alcohol/week: 0.0 standard drinks of alcohol    Comment: 4 oz rum every other night   Drug use: No     Allergies   Patient has no known allergies.   Review of Systems Review  of Systems  Respiratory:  Positive for cough.      Physical Exam Triage Vital Signs ED Triage Vitals [08/17/22 1515]  Enc Vitals Group     BP 112/77     Pulse Rate 100     Resp 18     Temp 98.3 F (36.8 C)     Temp Source Oral     SpO2 99 %     Weight      Height      Head Circumference      Peak Flow      Pain Score 5     Pain Loc      Pain Edu?      Excl. in GC?    No data found.  Updated Vital Signs BP 112/77   Pulse 100   Temp 98.3 F (36.8 C) (Oral)   Resp 18   SpO2 99%   Visual Acuity Right Eye Distance:   Left Eye Distance:   Bilateral Distance:    Right Eye Near:   Left Eye Near:    Bilateral Near:     Physical Exam   UC Treatments / Results  Labs (all labs ordered are listed, but only abnormal results are displayed) Labs Reviewed - No data to display  EKG   Radiology No results found.  Procedures Procedures (including critical care time)  Medications Ordered in UC Medications  albuterol (VENTOLIN HFA) 108 (90 Base)  MCG/ACT inhaler 2 puff (has no administration in time range)  predniSONE (DELTASONE) tablet 40 mg (has no administration in time range)    Initial Impression / Assessment and Plan / UC Course  I have reviewed the triage vital signs and the nursing notes.  Pertinent labs & imaging results that were available during my care of the patient were reviewed by me and considered in my medical decision making (see chart for details).     *** Final Clinical Impressions(s) / UC Diagnoses   Final diagnoses:  Acute bacterial sinusitis  Bronchitis  Wheeze     Discharge Instructions      Take Augmentin antibiotic twice daily for the next 7 days to treat bacterial sinus infection.  This is a stronger antibiotic than the azithromycin that you are given 2 weeks ago.  I hear wheezing in your chest, this is indicative of bronchitis which is inflammation of the upper airways.  Take prednisone 40 mg once daily with breakfast for the next 4 days.  I gave you your first dose at urgent care.  While you are taking this medicine, do not take any ibuprofen because this is a sister medicine.  Take this medicine with food to avoid stomach upset.  You may continue taking over-the-counter decongestants as needed.   Take Promethazine DM cough medication to help with your cough at nighttime so that you are able to sleep. Do not drive, drink alcohol, or go to work while taking this medication since it can make you sleepy. Only take this at nighttime.   May use albuterol inhaler every 4-6 hours as needed for cough, shortness of breath, and wheeze.  If you develop any new or worsening symptoms or do not improve in the next 2 to 3 days, please return.  If your symptoms are severe, please go to the emergency room.  Follow-up with your primary care provider for further evaluation and management of your symptoms as well as ongoing wellness visits.  I hope you feel better!   ED Prescriptions  Medication Sig  Dispense Auth. Provider   promethazine-dextromethorphan (PROMETHAZINE-DM) 6.25-15 MG/5ML syrup Take 5 mLs by mouth at bedtime as needed for cough. 118 mL Reita May M, FNP   amoxicillin-clavulanate (AUGMENTIN) 875-125 MG tablet Take 1 tablet by mouth every 12 (twelve) hours. 14 tablet Carlisle Beers, FNP      PDMP not reviewed this encounter.

## 2022-08-27 DIAGNOSIS — Z6830 Body mass index (BMI) 30.0-30.9, adult: Secondary | ICD-10-CM | POA: Diagnosis not present

## 2022-08-27 DIAGNOSIS — Z113 Encounter for screening for infections with a predominantly sexual mode of transmission: Secondary | ICD-10-CM | POA: Diagnosis not present

## 2022-08-27 DIAGNOSIS — Z1231 Encounter for screening mammogram for malignant neoplasm of breast: Secondary | ICD-10-CM | POA: Diagnosis not present

## 2022-08-27 DIAGNOSIS — Z01419 Encounter for gynecological examination (general) (routine) without abnormal findings: Secondary | ICD-10-CM | POA: Diagnosis not present

## 2022-08-27 LAB — HM MAMMOGRAPHY

## 2022-09-08 DIAGNOSIS — Z30433 Encounter for removal and reinsertion of intrauterine contraceptive device: Secondary | ICD-10-CM | POA: Diagnosis not present

## 2022-09-08 DIAGNOSIS — Z3202 Encounter for pregnancy test, result negative: Secondary | ICD-10-CM | POA: Diagnosis not present

## 2022-10-15 DIAGNOSIS — D649 Anemia, unspecified: Secondary | ICD-10-CM | POA: Diagnosis not present

## 2022-10-23 ENCOUNTER — Encounter: Payer: Self-pay | Admitting: Internal Medicine

## 2022-10-23 ENCOUNTER — Ambulatory Visit (INDEPENDENT_AMBULATORY_CARE_PROVIDER_SITE_OTHER): Payer: BC Managed Care – PPO | Admitting: Internal Medicine

## 2022-10-23 ENCOUNTER — Other Ambulatory Visit: Payer: Self-pay | Admitting: Internal Medicine

## 2022-10-23 ENCOUNTER — Other Ambulatory Visit (INDEPENDENT_AMBULATORY_CARE_PROVIDER_SITE_OTHER): Payer: BC Managed Care – PPO

## 2022-10-23 VITALS — BP 110/80 | HR 70 | Temp 98.1°F | Ht 68.5 in | Wt 204.4 lb

## 2022-10-23 DIAGNOSIS — E559 Vitamin D deficiency, unspecified: Secondary | ICD-10-CM

## 2022-10-23 DIAGNOSIS — E669 Obesity, unspecified: Secondary | ICD-10-CM | POA: Diagnosis not present

## 2022-10-23 DIAGNOSIS — Z23 Encounter for immunization: Secondary | ICD-10-CM

## 2022-10-23 DIAGNOSIS — D509 Iron deficiency anemia, unspecified: Secondary | ICD-10-CM

## 2022-10-23 DIAGNOSIS — E785 Hyperlipidemia, unspecified: Secondary | ICD-10-CM

## 2022-10-23 DIAGNOSIS — Z0000 Encounter for general adult medical examination without abnormal findings: Secondary | ICD-10-CM

## 2022-10-23 LAB — COMPREHENSIVE METABOLIC PANEL
ALT: 8 U/L (ref 0–35)
AST: 18 U/L (ref 0–37)
Albumin: 3.9 g/dL (ref 3.5–5.2)
Alkaline Phosphatase: 81 U/L (ref 39–117)
BUN: 11 mg/dL (ref 6–23)
CO2: 25 mEq/L (ref 19–32)
Calcium: 9.2 mg/dL (ref 8.4–10.5)
Chloride: 104 mEq/L (ref 96–112)
Creatinine, Ser: 0.68 mg/dL (ref 0.40–1.20)
GFR: 100.86 mL/min (ref >60.00)
Glucose, Bld: 76 mg/dL (ref 70–99)
Potassium: 3.8 mEq/L (ref 3.5–5.1)
Sodium: 139 mEq/L (ref 135–145)
Total Bilirubin: 0.5 mg/dL (ref 0.2–1.2)
Total Protein: 7.2 g/dL (ref 6.0–8.3)

## 2022-10-23 LAB — CBC WITH DIFFERENTIAL/PLATELET
Basophils Absolute: 0 10*3/uL (ref 0.0–0.1)
Basophils Relative: 0.6 % (ref 0.0–3.0)
Eosinophils Absolute: 0.1 10*3/uL (ref 0.0–0.7)
Eosinophils Relative: 3.2 % (ref 0.0–5.0)
HCT: 31.9 % — ABNORMAL LOW (ref 36.0–46.0)
Hemoglobin: 10.4 g/dL — ABNORMAL LOW (ref 12.0–15.0)
Lymphocytes Relative: 38.2 % (ref 12.0–46.0)
Lymphs Abs: 1.6 10*3/uL (ref 0.7–4.0)
MCHC: 32.4 g/dL (ref 30.0–36.0)
MCV: 83.1 fl (ref 78.0–100.0)
Monocytes Absolute: 0.4 10*3/uL (ref 0.1–1.0)
Monocytes Relative: 10 % (ref 3.0–12.0)
Neutro Abs: 2 10*3/uL (ref 1.4–7.7)
Neutrophils Relative %: 48 % (ref 43.0–77.0)
Platelets: 336 10*3/uL (ref 150.0–400.0)
RBC: 3.84 Mil/uL — ABNORMAL LOW (ref 3.87–5.11)
RDW: 18.6 % — ABNORMAL HIGH (ref 11.5–15.5)
WBC: 4.2 10*3/uL (ref 4.0–10.5)

## 2022-10-23 LAB — LIPID PANEL
Cholesterol: 170 mg/dL (ref 0–200)
HDL: 68.9 mg/dL (ref >39.00)
LDL Cholesterol: 88 mg/dL (ref 0–99)
NonHDL: 101.02
Total CHOL/HDL Ratio: 2
Triglycerides: 66 mg/dL (ref 0.0–149.0)
VLDL: 13.2 mg/dL (ref 0.0–40.0)

## 2022-10-23 LAB — VITAMIN B12: Vitamin B-12: 798 pg/mL (ref 211–911)

## 2022-10-23 LAB — IBC + FERRITIN
Ferritin: 4 ng/mL — ABNORMAL LOW (ref 10.0–291.0)
Iron: 35 ug/dL — ABNORMAL LOW (ref 42–145)
Saturation Ratios: 7.6 % — ABNORMAL LOW (ref 20.0–50.0)
TIBC: 462 ug/dL — ABNORMAL HIGH (ref 250.0–450.0)
Transferrin: 330 mg/dL (ref 212.0–360.0)

## 2022-10-23 LAB — VITAMIN D 25 HYDROXY (VIT D DEFICIENCY, FRACTURES): VITD: 20.06 ng/mL — ABNORMAL LOW (ref 30.00–100.00)

## 2022-10-23 LAB — TSH: TSH: 1.26 u[IU]/mL (ref 0.35–5.50)

## 2022-10-23 MED ORDER — VITAMIN D (ERGOCALCIFEROL) 1.25 MG (50000 UNIT) PO CAPS: 50000.0000 [IU] | ORAL_CAPSULE | ORAL | 0 refills | Status: AC

## 2022-10-23 NOTE — Addendum Note (Signed)
Addended by: Westley Hummer B on: 10/23/2022 12:07 PM   Modules accepted: Orders

## 2022-10-23 NOTE — Progress Notes (Signed)
Established Patient Office Visit     CC/Reason for Visit: Annual preventive exam  HPI: April Levine is a 52 y.o. female who is coming in today for the above mentioned reasons.  She has no past medical history of significance.  She is feeling well and has no acute concerns or complaints.  She is due for second shingles and COVID vaccines.  She has routine eye and dental care.  All cancer screening is up-to-date.   Past Medical/Surgical History: Past Medical History:  Diagnosis Date   Anemia    Anemia    Chicken pox    DVT (deep vein thrombosis) in pregnancy 2003    and post surgery left leg  knee in preg   MRSA (methicillin resistant staph aureus) culture positive     Past Surgical History:  Procedure Laterality Date   KNEE ARTHROSCOPY     had dvt post op    Social History:  reports that she has never smoked. She has never used smokeless tobacco. She reports current alcohol use. She reports that she does not use drugs.  Allergies: No Known Allergies  Family History:  Family History  Problem Relation Age of Onset   Alcohol abuse Other    Arthritis Mother    Hypertension Mother    Pulmonary embolism Mother    Dementia Father    Stroke Father    Alcohol abuse Maternal Grandmother     No current outpatient medications on file.  Review of Systems:  Negative unless indicated in HPI.   Physical Exam: Vitals:   10/23/22 0850  BP: 110/80  Pulse: 70  Temp: 98.1 F (36.7 C)  TempSrc: Oral  SpO2: 99%  Weight: 204 lb 6.4 oz (92.7 kg)  Height: 5' 8.5" (1.74 m)    Body mass index is 30.63 kg/m.   Physical Exam Vitals reviewed.  Constitutional:      General: She is not in acute distress.    Appearance: Normal appearance. She is not ill-appearing, toxic-appearing or diaphoretic.  HENT:     Head: Normocephalic.     Right Ear: Tympanic membrane, ear canal and external ear normal. There is no impacted cerumen.     Left Ear: Tympanic membrane, ear canal  and external ear normal. There is no impacted cerumen.     Nose: Nose normal.     Mouth/Throat:     Mouth: Mucous membranes are moist.     Pharynx: Oropharynx is clear. No oropharyngeal exudate or posterior oropharyngeal erythema.  Eyes:     General: No scleral icterus.       Right eye: No discharge.        Left eye: No discharge.     Conjunctiva/sclera: Conjunctivae normal.     Pupils: Pupils are equal, round, and reactive to light.  Neck:     Vascular: No carotid bruit.  Cardiovascular:     Rate and Rhythm: Normal rate and regular rhythm.     Pulses: Normal pulses.     Heart sounds: Normal heart sounds.  Pulmonary:     Effort: Pulmonary effort is normal. No respiratory distress.     Breath sounds: Normal breath sounds.  Abdominal:     General: Abdomen is flat. Bowel sounds are normal.     Palpations: Abdomen is soft.  Musculoskeletal:        General: Normal range of motion.     Cervical back: Normal range of motion.  Skin:    General: Skin is warm  and dry.  Neurological:     General: No focal deficit present.     Mental Status: She is alert and oriented to person, place, and time. Mental status is at baseline.  Psychiatric:        Mood and Affect: Mood normal.        Behavior: Behavior normal.        Thought Content: Thought content normal.        Judgment: Judgment normal.      Impression and Plan:  Visit for preventive health examination  Iron deficiency anemia, unspecified iron deficiency anemia type - Plan: CBC with Differential/Platelet  Vitamin D deficiency - Plan: Vitamin D, 25-hydroxy  Hyperlipidemia, unspecified hyperlipidemia type - Plan: Comprehensive metabolic panel, Lipid panel  Obesity (BMI 30.0-34.9) - Plan: TSH, Vitamin B12  -Recommend routine eye and dental care. -Healthy lifestyle discussed in detail. -Labs to be updated today. -Prostate cancer screening: Not applicable Health Maintenance  Topic Date Due   Zoster (Shingles) Vaccine (2 of 2)  10/08/2021   COVID-19 Vaccine (3 - 2023-24 season) 11/08/2022*   Hepatitis C Screening: USPSTF Recommendation to screen - Ages 18-79 yo.  07/04/2023*   HIV Screening  07/04/2023*   Pap Smear  03/26/2023   Mammogram  08/07/2023   Colon Cancer Screening  04/21/2028   DTaP/Tdap/Td vaccine (3 - Td or Tdap) 06/14/2030   Flu Shot  Completed   HPV Vaccine  Aged Out  *Topic was postponed. The date shown is not the original due date.    -Final shingles vaccine administered today. -Obtain records from GYN regarding Pap smear and mammogram, colonoscopy in 2019.     Lelon Frohlich, MD [facility]

## 2022-10-27 ENCOUNTER — Other Ambulatory Visit: Payer: Self-pay | Admitting: *Deleted

## 2022-10-27 ENCOUNTER — Encounter: Payer: Self-pay | Admitting: Gastroenterology

## 2022-10-27 DIAGNOSIS — D509 Iron deficiency anemia, unspecified: Secondary | ICD-10-CM

## 2022-11-17 DIAGNOSIS — D649 Anemia, unspecified: Secondary | ICD-10-CM | POA: Diagnosis not present

## 2022-12-16 ENCOUNTER — Ambulatory Visit: Payer: BC Managed Care – PPO | Admitting: Gastroenterology

## 2023-01-27 ENCOUNTER — Other Ambulatory Visit: Payer: Self-pay

## 2023-05-11 ENCOUNTER — Ambulatory Visit: Payer: BC Managed Care – PPO | Admitting: Internal Medicine

## 2023-05-11 ENCOUNTER — Encounter: Payer: Self-pay | Admitting: Internal Medicine

## 2023-05-11 DIAGNOSIS — E559 Vitamin D deficiency, unspecified: Secondary | ICD-10-CM

## 2023-05-11 DIAGNOSIS — E782 Mixed hyperlipidemia: Secondary | ICD-10-CM

## 2023-05-11 DIAGNOSIS — D509 Iron deficiency anemia, unspecified: Secondary | ICD-10-CM | POA: Diagnosis not present

## 2023-05-11 DIAGNOSIS — I872 Venous insufficiency (chronic) (peripheral): Secondary | ICD-10-CM | POA: Diagnosis not present

## 2023-05-11 DIAGNOSIS — Z23 Encounter for immunization: Secondary | ICD-10-CM

## 2023-05-11 LAB — LIPID PANEL
Cholesterol: 162 mg/dL (ref 0–200)
HDL: 67.4 mg/dL (ref >39.00)
LDL Cholesterol: 79 mg/dL (ref 0–99)
NonHDL: 94.75
Total CHOL/HDL Ratio: 2
Triglycerides: 79 mg/dL (ref 0.0–149.0)
VLDL: 15.8 mg/dL (ref 0.0–40.0)

## 2023-05-11 LAB — CBC WITH DIFFERENTIAL/PLATELET
Basophils Absolute: 0 10*3/uL (ref 0.0–0.1)
Basophils Relative: 0.4 % (ref 0.0–3.0)
Eosinophils Absolute: 0.1 10*3/uL (ref 0.0–0.7)
Eosinophils Relative: 2.4 % (ref 0.0–5.0)
HCT: 37 % (ref 36.0–46.0)
Hemoglobin: 11.8 g/dL — ABNORMAL LOW (ref 12.0–15.0)
Lymphocytes Relative: 40.2 % (ref 12.0–46.0)
Lymphs Abs: 1.7 10*3/uL (ref 0.7–4.0)
MCHC: 31.8 g/dL (ref 30.0–36.0)
MCV: 94.2 fl (ref 78.0–100.0)
Monocytes Absolute: 0.4 10*3/uL (ref 0.1–1.0)
Monocytes Relative: 8.7 % (ref 3.0–12.0)
Neutro Abs: 2.1 10*3/uL (ref 1.4–7.7)
Neutrophils Relative %: 48.3 % (ref 43.0–77.0)
Platelets: 238 10*3/uL (ref 150.0–400.0)
RBC: 3.93 Mil/uL (ref 3.87–5.11)
RDW: 14.3 % (ref 11.5–15.5)
WBC: 4.3 10*3/uL (ref 4.0–10.5)

## 2023-05-11 LAB — VITAMIN D 25 HYDROXY (VIT D DEFICIENCY, FRACTURES): VITD: 23.82 ng/mL — ABNORMAL LOW (ref 30.00–100.00)

## 2023-05-11 NOTE — Addendum Note (Signed)
Addended by: Kern Reap B on: 05/11/2023 11:51 AM   Modules accepted: Orders

## 2023-05-11 NOTE — Progress Notes (Signed)
Established Patient Office Visit     CC/Reason for Visit: Follow-up chronic conditions, discuss leg edema  HPI: April Levine is a 52 y.o. female who is coming in today for the above mentioned reasons. Past Medical History is significant for: Hyperlipidemia not on medication and vitamin D deficiency.  She would like the labs to follow-up on these levels.  Requesting flu vaccine.  She has now moved to [place].  Has been commuting back and forth.  Has been having some bilateral ankle edema that worsens as the day progresses.  No chest pain or dyspnea on exertion.   Past Medical/Surgical History: Past Medical History:  Diagnosis Date   Anemia    Anemia    Chicken pox    DVT (deep vein thrombosis) in pregnancy 2003    and post surgery left leg  knee in preg   MRSA (methicillin resistant staph aureus) culture positive     Past Surgical History:  Procedure Laterality Date   KNEE ARTHROSCOPY     had dvt post op    Social History:  reports that she has never smoked. She has never used smokeless tobacco. She reports current alcohol use. She reports that she does not use drugs.  Allergies: No Known Allergies  Family History:  Family History  Problem Relation Age of Onset   Alcohol abuse Other    Arthritis Mother    Hypertension Mother    Pulmonary embolism Mother    Dementia Father    Stroke Father    Alcohol abuse Maternal Grandmother     No current outpatient medications on file.  Review of Systems:  Negative unless indicated in HPI.   Physical Exam: Vitals:   05/11/23 1057  BP: 120/84  Pulse: 70  Temp: 98.2 F (36.8 C)  TempSrc: Oral  SpO2: 99%  Weight: 204 lb 1.6 oz (92.6 kg)    Body mass index is 30.58 kg/m.   Physical Exam Vitals reviewed.  Constitutional:      Appearance: Normal appearance.  HENT:     Head: Normocephalic and atraumatic.  Eyes:     Conjunctiva/sclera: Conjunctivae normal.     Pupils: Pupils are equal, round, and reactive  to light.  Cardiovascular:     Rate and Rhythm: Normal rate and regular rhythm.  Pulmonary:     Effort: Pulmonary effort is normal.     Breath sounds: Normal breath sounds.  Skin:    General: Skin is warm and dry.  Neurological:     General: No focal deficit present.     Mental Status: She is alert and oriented to person, place, and time.  Psychiatric:        Mood and Affect: Mood normal.        Behavior: Behavior normal.        Thought Content: Thought content normal.        Judgment: Judgment normal.      Impression and Plan:  Immunization due  Vitamin D deficiency -     VITAMIN D 25 Hydroxy (Vit-D Deficiency, Fractures); Future  Mixed hyperlipidemia -     Lipid panel; Future  Venous insufficiency   -Flu vaccine administered in office today. -Check labs to follow cholesterol and vitamin D. -She will go to medical supply store to be fitted for compression stockings which I believe will help with her ankle edema.  Time spent:30 minutes reviewing chart, interviewing and examining patient and formulating plan of care.     Peggye Pitt  Loreta Ave, MD [facility] at [facility]

## 2023-05-12 ENCOUNTER — Other Ambulatory Visit: Payer: Self-pay | Admitting: Internal Medicine

## 2023-05-12 DIAGNOSIS — E559 Vitamin D deficiency, unspecified: Secondary | ICD-10-CM

## 2023-05-12 MED ORDER — VITAMIN D (ERGOCALCIFEROL) 1.25 MG (50000 UNIT) PO CAPS
50000.0000 [IU] | ORAL_CAPSULE | ORAL | 0 refills | Status: AC
Start: 2023-05-12 — End: 2023-07-29

## 2023-07-28 ENCOUNTER — Other Ambulatory Visit: Payer: Self-pay | Admitting: Internal Medicine

## 2023-07-28 DIAGNOSIS — E559 Vitamin D deficiency, unspecified: Secondary | ICD-10-CM

## 2023-09-01 DIAGNOSIS — J0140 Acute pansinusitis, unspecified: Secondary | ICD-10-CM | POA: Diagnosis not present

## 2023-09-01 DIAGNOSIS — R059 Cough, unspecified: Secondary | ICD-10-CM | POA: Diagnosis not present

## 2023-09-14 DIAGNOSIS — N760 Acute vaginitis: Secondary | ICD-10-CM | POA: Diagnosis not present

## 2023-11-16 DIAGNOSIS — Z1151 Encounter for screening for human papillomavirus (HPV): Secondary | ICD-10-CM | POA: Diagnosis not present

## 2023-11-16 DIAGNOSIS — Z1231 Encounter for screening mammogram for malignant neoplasm of breast: Secondary | ICD-10-CM | POA: Diagnosis not present

## 2023-11-16 DIAGNOSIS — Z6832 Body mass index (BMI) 32.0-32.9, adult: Secondary | ICD-10-CM | POA: Diagnosis not present

## 2023-11-16 DIAGNOSIS — Z124 Encounter for screening for malignant neoplasm of cervix: Secondary | ICD-10-CM | POA: Diagnosis not present

## 2023-11-16 DIAGNOSIS — Z01419 Encounter for gynecological examination (general) (routine) without abnormal findings: Secondary | ICD-10-CM | POA: Diagnosis not present

## 2023-11-16 LAB — HM MAMMOGRAPHY

## 2023-11-20 LAB — HM PAP SMEAR: HPV, high-risk: NEGATIVE

## 2023-11-23 ENCOUNTER — Ambulatory Visit: Admitting: Internal Medicine

## 2023-11-23 ENCOUNTER — Encounter: Payer: Self-pay | Admitting: Internal Medicine

## 2023-11-23 VITALS — BP 110/80 | HR 75 | Temp 98.2°F | Wt 211.5 lb

## 2023-11-23 DIAGNOSIS — I872 Venous insufficiency (chronic) (peripheral): Secondary | ICD-10-CM

## 2023-11-23 NOTE — Progress Notes (Signed)
     Established Patient Office Visit     CC/Reason for Visit: Bilateral lower extremity edema  HPI: April Levine is a 53 y.o. female who is coming in today for the above mentioned reasons. Past Medical History is significant for: Known history of chronic venous insufficiency.  She also had a DVT 20 years ago with pregnancy.  I have seen her once before for this issue and recommended compression stockings.  She purchased them but has not been wearing them.  She feels like towards the end of the day both legs will be equally edematous and tight feeling.  No chest pain or shortness of breath.   Past Medical/Surgical History: Past Medical History:  Diagnosis Date   Anemia    Anemia    Chicken pox    DVT (deep vein thrombosis) in pregnancy 2003    and post surgery left leg  knee in preg   MRSA (methicillin resistant staph aureus) culture positive     Past Surgical History:  Procedure Laterality Date   KNEE ARTHROSCOPY     had dvt post op    Social History:  reports that she has never smoked. She has never used smokeless tobacco. She reports current alcohol use. She reports that she does not use drugs.  Allergies: No Known Allergies  Family History:  Family History  Problem Relation Age of Onset   Alcohol abuse Other    Arthritis Mother    Hypertension Mother    Pulmonary embolism Mother    Dementia Father    Stroke Father    Alcohol abuse Maternal Grandmother     No current outpatient medications on file.  Review of Systems:  Negative unless indicated in HPI.   Physical Exam: Vitals:   11/23/23 1433  BP: 110/80  Pulse: 75  Temp: 98.2 F (36.8 C)  TempSrc: Oral  SpO2: 99%  Weight: 211 lb 8 oz (95.9 kg)    Body mass index is 31.69 kg/m.   Physical Exam Cardiovascular:     Rate and Rhythm: Normal rate and regular rhythm.     Heart sounds: No murmur heard. Pulmonary:     Effort: Pulmonary effort is normal.     Breath sounds: Normal breath sounds.   Musculoskeletal:        General: No swelling.      Impression and Plan:  Chronic venous insufficiency   -No swelling is evident on exam today, suspect her issues are due to her chronic venous insufficiency and have again recommended leg elevation and compression stockings.  No sign of DVT.  Time spent:22 minutes reviewing chart, interviewing and examining patient and formulating plan of care.     Chaya Jan, MD [facility] at [facility]

## 2023-12-09 DIAGNOSIS — M25561 Pain in right knee: Secondary | ICD-10-CM | POA: Diagnosis not present

## 2023-12-09 DIAGNOSIS — M25562 Pain in left knee: Secondary | ICD-10-CM | POA: Diagnosis not present

## 2023-12-18 ENCOUNTER — Encounter: Admitting: Family Medicine

## 2023-12-31 ENCOUNTER — Encounter: Payer: Self-pay | Admitting: Internal Medicine

## 2023-12-31 ENCOUNTER — Ambulatory Visit (INDEPENDENT_AMBULATORY_CARE_PROVIDER_SITE_OTHER): Admitting: Internal Medicine

## 2023-12-31 VITALS — BP 110/80 | HR 76 | Temp 98.3°F | Ht 67.0 in | Wt 212.5 lb

## 2023-12-31 DIAGNOSIS — Z6833 Body mass index (BMI) 33.0-33.9, adult: Secondary | ICD-10-CM

## 2023-12-31 DIAGNOSIS — E559 Vitamin D deficiency, unspecified: Secondary | ICD-10-CM | POA: Diagnosis not present

## 2023-12-31 DIAGNOSIS — Z1159 Encounter for screening for other viral diseases: Secondary | ICD-10-CM | POA: Diagnosis not present

## 2023-12-31 DIAGNOSIS — Z0000 Encounter for general adult medical examination without abnormal findings: Secondary | ICD-10-CM

## 2023-12-31 DIAGNOSIS — D509 Iron deficiency anemia, unspecified: Secondary | ICD-10-CM | POA: Diagnosis not present

## 2023-12-31 DIAGNOSIS — E785 Hyperlipidemia, unspecified: Secondary | ICD-10-CM

## 2023-12-31 DIAGNOSIS — Z23 Encounter for immunization: Secondary | ICD-10-CM

## 2023-12-31 DIAGNOSIS — Z114 Encounter for screening for human immunodeficiency virus [HIV]: Secondary | ICD-10-CM | POA: Diagnosis not present

## 2023-12-31 DIAGNOSIS — E041 Nontoxic single thyroid nodule: Secondary | ICD-10-CM | POA: Diagnosis not present

## 2023-12-31 DIAGNOSIS — E66811 Obesity, class 1: Secondary | ICD-10-CM | POA: Diagnosis not present

## 2023-12-31 DIAGNOSIS — E669 Obesity, unspecified: Secondary | ICD-10-CM | POA: Diagnosis not present

## 2023-12-31 DIAGNOSIS — E049 Nontoxic goiter, unspecified: Secondary | ICD-10-CM

## 2023-12-31 LAB — CBC WITH DIFFERENTIAL/PLATELET
Basophils Absolute: 0 10*3/uL (ref 0.0–0.1)
Basophils Relative: 0.8 % (ref 0.0–3.0)
Eosinophils Absolute: 0.1 10*3/uL (ref 0.0–0.7)
Eosinophils Relative: 2.8 % (ref 0.0–5.0)
HCT: 39.8 % (ref 36.0–46.0)
Hemoglobin: 13 g/dL (ref 12.0–15.0)
Lymphocytes Relative: 41.9 % (ref 12.0–46.0)
Lymphs Abs: 1.7 10*3/uL (ref 0.7–4.0)
MCHC: 32.6 g/dL (ref 30.0–36.0)
MCV: 95.2 fl (ref 78.0–100.0)
Monocytes Absolute: 0.3 10*3/uL (ref 0.1–1.0)
Monocytes Relative: 7.8 % (ref 3.0–12.0)
Neutro Abs: 1.9 10*3/uL (ref 1.4–7.7)
Neutrophils Relative %: 46.7 % (ref 43.0–77.0)
Platelets: 281 10*3/uL (ref 150.0–400.0)
RBC: 4.18 Mil/uL (ref 3.87–5.11)
RDW: 13.8 % (ref 11.5–15.5)
WBC: 4 10*3/uL (ref 4.0–10.5)

## 2023-12-31 LAB — COMPREHENSIVE METABOLIC PANEL WITH GFR
ALT: 10 U/L (ref 0–35)
AST: 18 U/L (ref 0–37)
Albumin: 4.2 g/dL (ref 3.5–5.2)
Alkaline Phosphatase: 93 U/L (ref 39–117)
BUN: 12 mg/dL (ref 6–23)
CO2: 27 meq/L (ref 19–32)
Calcium: 9.1 mg/dL (ref 8.4–10.5)
Chloride: 104 meq/L (ref 96–112)
Creatinine, Ser: 0.73 mg/dL (ref 0.40–1.20)
GFR: 94.45 mL/min (ref >60.00)
Glucose, Bld: 79 mg/dL (ref 70–99)
Potassium: 4 meq/L (ref 3.5–5.1)
Sodium: 139 meq/L (ref 135–145)
Total Bilirubin: 0.4 mg/dL (ref 0.2–1.2)
Total Protein: 7.5 g/dL (ref 6.0–8.3)

## 2023-12-31 LAB — VITAMIN B12: Vitamin B-12: 877 pg/mL (ref 211–911)

## 2023-12-31 LAB — TSH: TSH: 1.31 u[IU]/mL (ref 0.35–5.50)

## 2023-12-31 LAB — LIPID PANEL
Cholesterol: 193 mg/dL (ref 0–200)
HDL: 66.8 mg/dL (ref >39.00)
LDL Cholesterol: 110 mg/dL — ABNORMAL HIGH (ref 0–99)
NonHDL: 126.24
Total CHOL/HDL Ratio: 3
Triglycerides: 81 mg/dL (ref 0.0–149.0)
VLDL: 16.2 mg/dL (ref 0.0–40.0)

## 2023-12-31 LAB — VITAMIN D 25 HYDROXY (VIT D DEFICIENCY, FRACTURES): VITD: 26.64 ng/mL — ABNORMAL LOW (ref 30.00–100.00)

## 2023-12-31 NOTE — Addendum Note (Signed)
 Addended by: Nicolina Barrios B on: 12/31/2023 10:11 AM   Modules accepted: Orders

## 2023-12-31 NOTE — Progress Notes (Signed)
 Established Patient Office Visit     CC/Reason for Visit: Annual preventive exam  HPI: April Levine is a 53 y.o. female who is coming in today for the above mentioned reasons. Past Medical History is significant for: Chronic venous insufficiency, hyperlipidemia, vitamin D  deficiency prior history of DVT.  Feeling well.  Has routine eye and dental care.  Obtain records from GYN.  Is due for PCV 20.   Past Medical/Surgical History: Past Medical History:  Diagnosis Date   Anemia    Anemia    Chicken pox    DVT (deep vein thrombosis) in pregnancy 2003    and post surgery left leg  knee in preg   MRSA (methicillin resistant staph aureus) culture positive     Past Surgical History:  Procedure Laterality Date   KNEE ARTHROSCOPY     had dvt post op    Social History:  reports that she has never smoked. She has never used smokeless tobacco. She reports current alcohol use. She reports that she does not use drugs.  Allergies: No Known Allergies  Family History:  Family History  Problem Relation Age of Onset   Alcohol abuse Other    Arthritis Mother    Hypertension Mother    Pulmonary embolism Mother    Dementia Father    Stroke Father    Alcohol abuse Maternal Grandmother     No current outpatient medications on file.  Review of Systems:  Negative unless indicated in HPI.   Physical Exam: Vitals:   12/31/23 0906  BP: 110/80  Pulse: 76  Temp: 98.3 F (36.8 C)  TempSrc: Oral  SpO2: 99%  Weight: 212 lb 8 oz (96.4 kg)  Height: 5\' 7"  (1.702 m)    Body mass index is 33.28 kg/m.   Physical Exam Vitals reviewed.  Constitutional:      General: She is not in acute distress.    Appearance: Normal appearance. She is not ill-appearing, toxic-appearing or diaphoretic.  HENT:     Head: Normocephalic.     Right Ear: Tympanic membrane, ear canal and external ear normal. There is no impacted cerumen.     Left Ear: Tympanic membrane, ear canal and external ear  normal. There is no impacted cerumen.     Nose: Nose normal.     Mouth/Throat:     Mouth: Mucous membranes are moist.     Pharynx: Oropharynx is clear. No oropharyngeal exudate or posterior oropharyngeal erythema.  Eyes:     General: No scleral icterus.       Right eye: No discharge.        Left eye: No discharge.     Conjunctiva/sclera: Conjunctivae normal.     Pupils: Pupils are equal, round, and reactive to light.  Neck:     Vascular: No carotid bruit.  Cardiovascular:     Rate and Rhythm: Normal rate and regular rhythm.     Pulses: Normal pulses.     Heart sounds: Normal heart sounds.  Pulmonary:     Effort: Pulmonary effort is normal. No respiratory distress.     Breath sounds: Normal breath sounds.  Abdominal:     General: Abdomen is flat. Bowel sounds are normal.     Palpations: Abdomen is soft.  Musculoskeletal:        General: Normal range of motion.     Cervical back: Normal range of motion.  Skin:    General: Skin is warm and dry.  Neurological:  General: No focal deficit present.     Mental Status: She is alert and oriented to person, place, and time. Mental status is at baseline.  Psychiatric:        Mood and Affect: Mood normal.        Behavior: Behavior normal.        Thought Content: Thought content normal.        Judgment: Judgment normal.     Impression and Plan:  Encounter for preventive health examination  Vitamin D  deficiency -     VITAMIN D  25 Hydroxy (Vit-D Deficiency, Fractures); Future  Iron  deficiency anemia, unspecified iron  deficiency anemia type -     CBC with Differential/Platelet; Future  Hyperlipidemia, unspecified hyperlipidemia type -     Comprehensive metabolic panel with GFR; Future -     Lipid panel; Future  Visit for preventive health examination  Obesity (BMI 30.0-34.9) -     Vitamin B12; Future  GOITER, UNSPECIFIED  THYROID  CYST -     TSH; Future  Immunization due  Encounter for hepatitis C screening test for  low risk patient -     Hepatitis C antibody; Future  Encounter for screening for HIV -     HIV Antibody (routine testing w rflx); Future   -Recommend routine eye and dental care. -Healthy lifestyle discussed in detail. -Labs to be updated today. -Prostate cancer screening: Not applicable Health Maintenance  Topic Date Due   HIV Screening  Never done   Hepatitis C Screening  Never done   COVID-19 Vaccine (3 - 2024-25 season) 04/26/2023   Flu Shot  03/25/2024   Mammogram  08/27/2024   Pap with HPV screening  08/09/2026   Colon Cancer Screening  04/21/2028   DTaP/Tdap/Td vaccine (3 - Td or Tdap) 06/14/2030   Zoster (Shingles) Vaccine  Completed   HPV Vaccine  Aged Out   Meningitis B Vaccine  Aged Out     -PCV 20 in office today. - Obtain records from GYN.    Marguerita Shih, MD [facility] at [facility]

## 2024-01-01 LAB — HIV ANTIBODY (ROUTINE TESTING W REFLEX): HIV 1&2 Ab, 4th Generation: NONREACTIVE

## 2024-01-01 LAB — HEPATITIS C ANTIBODY: Hepatitis C Ab: NONREACTIVE

## 2024-01-04 ENCOUNTER — Other Ambulatory Visit: Payer: Self-pay | Admitting: Internal Medicine

## 2024-01-04 ENCOUNTER — Encounter: Payer: Self-pay | Admitting: Internal Medicine

## 2024-01-04 DIAGNOSIS — E559 Vitamin D deficiency, unspecified: Secondary | ICD-10-CM

## 2024-01-04 MED ORDER — VITAMIN D (ERGOCALCIFEROL) 1.25 MG (50000 UNIT) PO CAPS: 50000.0000 [IU] | ORAL_CAPSULE | ORAL | 0 refills | Status: AC

## 2024-02-20 DIAGNOSIS — L235 Allergic contact dermatitis due to other chemical products: Secondary | ICD-10-CM | POA: Diagnosis not present

## 2024-04-24 ENCOUNTER — Other Ambulatory Visit: Payer: Self-pay

## 2024-04-24 ENCOUNTER — Ambulatory Visit (HOSPITAL_COMMUNITY)
Admission: EM | Admit: 2024-04-24 | Discharge: 2024-04-24 | Disposition: A | Discharge status: Home / Self Care | Attending: Emergency Medicine | Admitting: Emergency Medicine

## 2024-04-24 ENCOUNTER — Encounter (HOSPITAL_COMMUNITY): Payer: Self-pay | Admitting: Emergency Medicine

## 2024-04-24 DIAGNOSIS — M25431 Effusion, right wrist: Secondary | ICD-10-CM

## 2024-04-24 NOTE — ED Provider Notes (Signed)
 [facility]    CSN: [id number] Arrival date & time: 04/24/24  1640      History   Chief Complaint Chief Complaint  Patient presents with   Wrist Pain    HPI PANDA CROSSIN is a 53 y.o. female.   Patient presents with swelling to her right wrist that began today.  Patient denies any pain related to this.  Patient denies any recent falls or known injuries.  Patient denies history of arthritis, gout, or carpal tunnel syndrome.  The history is provided by the patient and medical records.  Wrist Pain    Past Medical History:  Diagnosis Date   Anemia    Anemia    Chicken pox    DVT (deep vein thrombosis) in pregnancy 2003    and post surgery left leg  knee in preg   MRSA (methicillin resistant staph aureus) culture positive     Patient Active Problem List   Diagnosis Date Noted   Vitamin D  deficiency 08/10/2020   Hyperlipidemia 08/10/2020   Anemia, iron  deficiency 09/30/2013   Visit for preventive health examination 06/15/2013   Abscess of abdominal wall 07/25/2011   Foot pain, right 12/30/2010   THYROID  CYST 04/02/2010   GOITER, UNSPECIFIED 03/11/2010   ANEMIA-NOS 03/12/2009    Past Surgical History:  Procedure Laterality Date   KNEE ARTHROSCOPY     had dvt post op    OB History     Gravida  6   Para  2   Term      Preterm      AB      Living         SAB      IAB      Ectopic      Multiple      Live Births               Home Medications    Prior to Admission medications   Not on File    Family History Family History  Problem Relation Age of Onset   Alcohol abuse Other    Arthritis Mother    Hypertension Mother    Pulmonary embolism Mother    Dementia Father    Stroke Father    Alcohol abuse Maternal Grandmother     Social History Social History   Tobacco Use   Smoking status: Never   Smokeless tobacco: Never  Vaping Use   Vaping status: Never Used  Substance Use Topics   Alcohol use: Yes     Alcohol/week: 0.0 standard drinks of alcohol    Comment: 4 oz rum every other night   Drug use: No     Allergies   Patient has no known allergies.   Review of Systems Review of Systems  Per HPI  Physical Exam Triage Vital Signs ED Triage Vitals [04/24/24 1720]  Encounter Vitals Group     BP (!) 150/87     Girls Systolic BP Percentile      Girls Diastolic BP Percentile      Boys Systolic BP Percentile      Boys Diastolic BP Percentile      Pulse Rate 66     Resp 18     Temp 98 F (36.7 C)     Temp Source Oral     SpO2 98 %     Weight      Height      Head Circumference      Peak Flow  Pain Score 0     Pain Loc      Pain Education      Exclude from Growth Chart    No data found.  Updated Vital Signs BP (!) 150/87 (BP Location: Right Arm)   Pulse 66   Temp 98 F (36.7 C) (Oral)   Resp 18   SpO2 98%   Visual Acuity Right Eye Distance:   Left Eye Distance:   Bilateral Distance:    Right Eye Near:   Left Eye Near:    Bilateral Near:     Physical Exam Vitals and nursing note reviewed.  Constitutional:      General: She is awake. She is not in acute distress.    Appearance: Normal appearance. She is well-developed and well-groomed. She is not ill-appearing.  Musculoskeletal:     Right forearm: Normal.     Right wrist: Swelling present. No deformity, tenderness, bony tenderness or snuff box tenderness. Normal range of motion.     Right hand: Normal.     Comments: Area of swelling noted to the posterior medial aspect of the right wrist.  Nontender by palpation.  No obvious deformity or effusion noted.  Without decreased range of motion or pain with movement.  Skin:    General: Skin is warm and dry.     Findings: No erythema.  Neurological:     Mental Status: She is alert.  Psychiatric:        Behavior: Behavior is cooperative.      UC Treatments / Results  Labs (all labs ordered are listed, but only abnormal results are displayed) Labs  Reviewed - No data to display  EKG   Radiology No results found.  Procedures Procedures (including critical care time)  Medications Ordered in UC Medications - No data to display  Initial Impression / Assessment and Plan / UC Course  I have reviewed the triage vital signs and the nursing notes.  Pertinent labs & imaging results that were available during my care of the patient were reviewed by me and considered in my medical decision making (see chart for details).     Patient is overall well-appearing.  Vitals are stable.  Symptoms could be related to some underlying bursitis possibly.  Deferred steroids or medication due to lack of pain related to this.  Recommended applying ice to the area to help reduce swelling.  Provided patient with Ace wrap to provide compression to the area to help reduce swelling.  Discussed follow-up and return precautions. Final Clinical Impressions(s) / UC Diagnoses   Final diagnoses:  Swelling of right wrist     Discharge Instructions      I recommend applying ice to the area and attempt to reduce swelling. You can also wear the Ace wrap for compression to help reduce swelling Rest and elevate practically throughout the day. If you develop worsening swelling or begin to have pain return here for reevaluation.  Otherwise follow-up with your primary care provider for further evaluation and management of this.   ED Prescriptions   None    PDMP not reviewed this encounter.   Johnie Flaming A, NP 04/24/24 [phone]

## 2024-04-24 NOTE — ED Triage Notes (Signed)
 Pt here for concern of right wrist swollen starting today. Pt denies any fall or injury, no pain at this time.

## 2024-04-24 NOTE — Discharge Instructions (Addendum)
 I recommend applying ice to the area and attempt to reduce swelling. You can also wear the Ace wrap for compression to help reduce swelling Rest and elevate practically throughout the day. If you develop worsening swelling or begin to have pain return here for reevaluation.  Otherwise follow-up with your primary care provider for further evaluation and management of this.
